# Patient Record
Sex: Female | Born: 1985 | Race: Black or African American | Hispanic: No | Marital: Married | Smoking: Never smoker
Health system: Southern US, Community
[De-identification: ages and names within clinical notes are randomized; demographics above are authoritative.]

## PROBLEM LIST (undated history)

## (undated) HISTORY — PX: MOUTH SURGERY: SHX715

---

## 2009-08-11 ENCOUNTER — Emergency Department (HOSPITAL_BASED_OUTPATIENT_CLINIC_OR_DEPARTMENT_OTHER): Admission: EM | Admit: 2009-08-11 | Discharge: 2009-08-11 | Payer: Self-pay | Admitting: Emergency Medicine

## 2012-03-16 ENCOUNTER — Emergency Department (HOSPITAL_BASED_OUTPATIENT_CLINIC_OR_DEPARTMENT_OTHER)
Admission: EM | Admit: 2012-03-16 | Discharge: 2012-03-16 | Disposition: A | Discharge status: Home / Self Care | Payer: Self-pay

## 2012-03-16 NOTE — ED Notes (Signed)
Patient registered to be seen, came to registration desk and stated she had to leave.

## 2014-02-08 ENCOUNTER — Telehealth: Payer: Self-pay | Admitting: *Deleted

## 2014-02-08 NOTE — Telephone Encounter (Signed)
Dr. Shawnie Ponsorn (OB/Gyn) office called.  Pt is there for an appt and our name is on her [facility] card.  One time NPI # given.  Office will instruct patient to have her card changed.  Emma Hensley, Darlyne RussianKristen L, CMA

## 2014-05-26 ENCOUNTER — Emergency Department (HOSPITAL_BASED_OUTPATIENT_CLINIC_OR_DEPARTMENT_OTHER): Payer: Medicaid Other

## 2014-05-26 ENCOUNTER — Emergency Department (HOSPITAL_BASED_OUTPATIENT_CLINIC_OR_DEPARTMENT_OTHER)
Admission: EM | Admit: 2014-05-26 | Discharge: 2014-05-26 | Disposition: A | Discharge status: Home / Self Care | Payer: Medicaid Other | Attending: Emergency Medicine | Admitting: Emergency Medicine

## 2014-05-26 ENCOUNTER — Encounter (HOSPITAL_BASED_OUTPATIENT_CLINIC_OR_DEPARTMENT_OTHER): Payer: Self-pay | Admitting: Emergency Medicine

## 2014-05-26 DIAGNOSIS — Y9289 Other specified places as the place of occurrence of the external cause: Secondary | ICD-10-CM | POA: Insufficient documentation

## 2014-05-26 DIAGNOSIS — IMO0002 Reserved for concepts with insufficient information to code with codable children: Secondary | ICD-10-CM | POA: Insufficient documentation

## 2014-05-26 DIAGNOSIS — S9030XA Contusion of unspecified foot, initial encounter: Secondary | ICD-10-CM | POA: Diagnosis not present

## 2014-05-26 DIAGNOSIS — S8990XA Unspecified injury of unspecified lower leg, initial encounter: Secondary | ICD-10-CM | POA: Diagnosis present

## 2014-05-26 DIAGNOSIS — Y9389 Activity, other specified: Secondary | ICD-10-CM | POA: Diagnosis not present

## 2014-05-26 DIAGNOSIS — S9031XA Contusion of right foot, initial encounter: Secondary | ICD-10-CM

## 2014-05-26 MED ORDER — IBUPROFEN 800 MG PO TABS: 800.0000 mg | ORAL_TABLET | Freq: Three times a day (TID) | ORAL | Status: DC

## 2014-05-26 MED ORDER — HYDROCODONE-ACETAMINOPHEN 5-325 MG PO TABS: 2.0000 | ORAL_TABLET | ORAL | Status: DC | PRN

## 2014-05-26 NOTE — ED Notes (Addendum)
Pt reports someone stepped on her right foot last night - pt presents with pain to top of right foot, states pain is radiating up to right knee. Pt reports when she applies weight to RLE - the bottom of her right foot hurts.

## 2014-05-26 NOTE — ED Provider Notes (Signed)
CSN: [id number]     Arrival date & time 05/26/14  1228 History   First MD Initiated Contact with Patient 05/26/14 1257     Chief Complaint  Patient presents with  . Foot Pain     (Consider location/radiation/quality/duration/timing/severity/associated sxs/prior Treatment) Patient is a 28 y.o. female presenting with lower extremity pain. The history is provided by the patient. No language interpreter was used.  Foot Pain This is a new problem. The current episode started yesterday. The problem occurs constantly. Associated symptoms include myalgias. Pertinent negatives include no joint swelling or numbness. Nothing aggravates the symptoms. She has tried nothing for the symptoms. The treatment provided no relief.    History reviewed. No pertinent past medical history. Past Surgical History  Procedure Laterality Date  . Cesarean section      2   History reviewed. No pertinent family history. History  Substance Use Topics  . Smoking status: Never Smoker   . Smokeless tobacco: Not on file  . Alcohol Use: Yes     Comment: occasional   OB History   Grav Para Term Preterm Abortions TAB SAB Ect Mult Living                 Review of Systems  Musculoskeletal: Positive for myalgias. Negative for joint swelling.  Neurological: Negative for numbness.  All other systems reviewed and are negative.     Allergies  Review of patient's allergies indicates no known allergies.  Home Medications   Prior to Admission medications   Not on File   BP 132/60  Pulse 89  Temp(Src) 98 F (36.7 C) (Oral)  Resp 18  Ht 5\' 3"  (1.6 m)  Wt 200 lb (90.719 kg)  BMI 35.44 kg/m2  SpO2 100%  LMP 05/26/2014 Physical Exam  Constitutional: She is oriented to person, place, and time. She appears well-developed and well-nourished.  HENT:  Head: Normocephalic.  Musculoskeletal: She exhibits tenderness.  Tender mid dorsal foot,   From,  Pain with range of motion  Neurological: She is alert and  oriented to person, place, and time. She has normal reflexes.  Skin: Skin is warm.  Psychiatric: She has a normal mood and affect.    ED Course  Procedures (including critical care time) Labs Review Labs Reviewed - No data to display  Imaging Review Dg Foot Complete Right  05/26/2014   CLINICAL DATA:  Someone stepped on her foot last night, pain at top and bottom of foot, cannot bear weight  EXAM: RIGHT FOOT COMPLETE - 3+ VIEW  COMPARISON:  None  FINDINGS: Osseous mineralization normal.  Joint spaces preserved.  No fracture, dislocation, or bone destruction.  IMPRESSION: Normal exam.   Electronically Signed   By: Ulyses SouthwardMark  Boles M.D.   On: 05/26/2014 13:55     EKG Interpretation None      MDM   Final diagnoses:  Contusion, foot, right, initial encounter   Post op shoe Ace wrap Crutches [place] referral    Elson AreasLeslie K Cree Napoli, PA-C 05/26/14 1443

## 2014-05-26 NOTE — ED Provider Notes (Signed)
Medical screening examination/treatment/procedure(s) were performed by non-physician practitioner and as supervising physician I was immediately available for consultation/collaboration.   EKG Interpretation None        Harshita Bernales F Chau Savell, MD 05/26/14 1509 

## 2014-05-26 NOTE — Discharge Instructions (Signed)

## 2014-07-14 ENCOUNTER — Emergency Department (HOSPITAL_BASED_OUTPATIENT_CLINIC_OR_DEPARTMENT_OTHER)
Admission: EM | Admit: 2014-07-14 | Discharge: 2014-07-14 | Disposition: A | Discharge status: Home / Self Care | Payer: Medicaid Other | Attending: Emergency Medicine | Admitting: Emergency Medicine

## 2014-07-14 ENCOUNTER — Encounter (HOSPITAL_BASED_OUTPATIENT_CLINIC_OR_DEPARTMENT_OTHER): Payer: Self-pay | Admitting: Emergency Medicine

## 2014-07-14 DIAGNOSIS — B354 Tinea corporis: Secondary | ICD-10-CM | POA: Diagnosis not present

## 2014-07-14 DIAGNOSIS — Z79899 Other long term (current) drug therapy: Secondary | ICD-10-CM | POA: Diagnosis not present

## 2014-07-14 DIAGNOSIS — R21 Rash and other nonspecific skin eruption: Secondary | ICD-10-CM | POA: Diagnosis present

## 2014-07-14 DIAGNOSIS — Z791 Long term (current) use of non-steroidal anti-inflammatories (NSAID): Secondary | ICD-10-CM | POA: Insufficient documentation

## 2014-07-14 MED ORDER — KETOCONAZOLE 2 % EX CREA: 1.0000 "application " | TOPICAL_CREAM | Freq: Every day | CUTANEOUS | Status: DC

## 2014-07-14 NOTE — Discharge Instructions (Signed)

## 2014-07-14 NOTE — ED Provider Notes (Signed)
CSN: [id number]     Arrival date & time 07/14/14  7829 History   First MD Initiated Contact with Patient 07/14/14 [phone]     Chief Complaint  Patient presents with  . Rash    bilateral arms and on back     (Consider location/radiation/quality/duration/timing/severity/associated sxs/prior Treatment) Patient is a 28 y.o. female presenting with rash.  Rash Location:  Shoulder/arm Shoulder/arm rash location:  L arm and R arm Quality: dryness   Severity:  Mild Onset quality:  Unable to specify Timing:  Constant Progression:  Worsening Relieved by:  Nothing Worsened by:  Nothing tried Ineffective treatments:  None tried Associated symptoms: no abdominal pain, no diarrhea, no fatigue, no fever, no headaches, no induration, no joint pain, no myalgias and no nausea     History reviewed. No pertinent past medical history. Past Surgical History  Procedure Laterality Date  . Cesarean section      2   History reviewed. No pertinent family history. History  Substance Use Topics  . Smoking status: Never Smoker   . Smokeless tobacco: Not on file  . Alcohol Use: Yes     Comment: occasional   OB History   Grav Para Term Preterm Abortions TAB SAB Ect Mult Living                 Review of Systems  Constitutional: Negative for fever and fatigue.  Gastrointestinal: Negative for nausea, abdominal pain and diarrhea.  Musculoskeletal: Negative for arthralgias and myalgias.  Skin: Positive for rash.  Neurological: Negative for headaches.  All other systems reviewed and are negative.     Allergies  Review of patient's allergies indicates no known allergies.  Home Medications   Prior to Admission medications   Medication Sig Start Date End Date Taking? Authorizing Provider  HYDROcodone-acetaminophen (NORCO/VICODIN) 5-325 MG per tablet Take 2 tablets by mouth every 4 (four) hours as needed. 05/26/14   Elson Areas, PA-C  ibuprofen (ADVIL,MOTRIN) 800 MG tablet Take 1 tablet (800 mg  total) by mouth 3 (three) times daily. 05/26/14   Elson Areas, PA-C  ketoconazole (NIZORAL) 2 % cream Apply 1 application topically daily. 07/14/14   Hilario Quarry, MD   BP 132/55  Pulse 85  Temp(Src) 98 F (36.7 C) (Oral)  Resp 18  Ht  (1.6 m)  Wt 190 lb (86.183 kg)  BMI 33.67 kg/m2  SpO2 100% Physical Exam  Nursing note and vitals reviewed. Constitutional: She is oriented to person, place, and time. She appears well-developed and well-nourished.  HENT:  Head: Normocephalic.  Eyes: Pupils are equal, round, and reactive to light.  Neck: Normal range of motion.  Cardiovascular: Normal rate.   Pulmonary/Chest: Effort normal.  Abdominal: Soft.  Musculoskeletal: Normal range of motion.  Neurological: She is alert and oriented to person, place, and time.  Skin: Skin is warm and dry. Rash noted.     Two round skin patches 3x3 cm lighter that surrounding skin    ED Course  Procedures (including critical care time) L MDM   Final diagnoses:  Tinea corporis        Hilario Quarry, MD 07/14/14 [phone]

## 2014-07-14 NOTE — ED Notes (Signed)
MD at bedside. 

## 2015-04-06 ENCOUNTER — Emergency Department (HOSPITAL_BASED_OUTPATIENT_CLINIC_OR_DEPARTMENT_OTHER)
Admission: EM | Admit: 2015-04-06 | Discharge: 2015-04-06 | Disposition: A | Discharge status: Home / Self Care | Payer: Medicaid Other | Attending: Emergency Medicine | Admitting: Emergency Medicine

## 2015-04-06 ENCOUNTER — Encounter (HOSPITAL_BASED_OUTPATIENT_CLINIC_OR_DEPARTMENT_OTHER): Payer: Self-pay

## 2015-04-06 DIAGNOSIS — Z5189 Encounter for other specified aftercare: Secondary | ICD-10-CM

## 2015-04-06 DIAGNOSIS — Z792 Long term (current) use of antibiotics: Secondary | ICD-10-CM | POA: Insufficient documentation

## 2015-04-06 DIAGNOSIS — G8918 Other acute postprocedural pain: Secondary | ICD-10-CM | POA: Insufficient documentation

## 2015-04-06 DIAGNOSIS — Z4801 Encounter for change or removal of surgical wound dressing: Secondary | ICD-10-CM | POA: Insufficient documentation

## 2015-04-06 DIAGNOSIS — K1379 Other lesions of oral mucosa: Secondary | ICD-10-CM | POA: Diagnosis not present

## 2015-04-06 DIAGNOSIS — R220 Localized swelling, mass and lump, head: Secondary | ICD-10-CM | POA: Insufficient documentation

## 2015-04-06 MED ORDER — OXYCODONE-ACETAMINOPHEN 5-325 MG PO TABS
ORAL_TABLET | ORAL | Status: DC
Start: 2015-04-06 — End: 2020-08-15

## 2015-04-06 MED ORDER — METRONIDAZOLE 500 MG PO TABS: 500.0000 mg | ORAL_TABLET | Freq: Two times a day (BID) | ORAL | Status: DC

## 2015-04-06 MED ORDER — OXYCODONE-ACETAMINOPHEN 5-325 MG PO TABS
1.0000 | ORAL_TABLET | Freq: Once | ORAL | Status: AC
  Administered 2015-04-06: 1 via ORAL
  Filled 2015-04-06: qty 1

## 2015-04-06 MED ORDER — METRONIDAZOLE 500 MG PO TABS
500.0000 mg | ORAL_TABLET | Freq: Once | ORAL | Status: AC
  Administered 2015-04-06: 500 mg via ORAL
  Filled 2015-04-06: qty 1

## 2015-04-06 NOTE — ED Notes (Signed)
Pt reports oral surgery on Wednesday - 8 teeth removed including wisdom teeth - pt reports she began to eat today but thinks she has something "stuck in her gums."

## 2015-04-06 NOTE — ED Provider Notes (Signed)
CSN: [id number]     Arrival date & time 04/06/15  2009 History   First MD Initiated Contact with Patient 04/06/15 2023     Chief Complaint  Patient presents with  . Post-op Problem     (Consider location/radiation/quality/duration/timing/severity/associated sxs/prior Treatment) HPI   Emma Hensley is a 29 y.o. female complaining of foreign body sensation to right upper side of dental extraction. Patient had 8 extractions 3 days ago. She's compliant with her penicillin and has been taking Vicodin and ibuprofen at home with little relief. She states that she started eating solid food today but she feels that something is stuck in her gums. She denies increasing swelling, fever, chills, difficulty swallowing since the extraction. Patient has been rinsing the mouth out but still feels like something is stuck in the area. She rates her pain at 9 out of 10, she's taking Vicodin at home with little relief.  History reviewed. No pertinent past medical history. Past Surgical History  Procedure Laterality Date  . Cesarean section      2  . Mouth surgery     History reviewed. No pertinent family history. History  Substance Use Topics  . Smoking status: Never Smoker   . Smokeless tobacco: Not on file  . Alcohol Use: Yes     Comment: occasional   OB History    No data available     Review of Systems  10 systems reviewed and found to be negative, except as noted in the HPI.   Allergies  Review of patient's allergies indicates no known allergies.  Home Medications   Prior to Admission medications   Medication Sig Start Date End Date Taking? Authorizing Provider  ibuprofen (ADVIL,MOTRIN) 600 MG tablet Take 600 mg by mouth every 6 (six) hours as needed.   Yes Historical Provider, MD  PENICILLIN V POTASSIUM PO Take by mouth.   Yes Historical Provider, MD  metroNIDAZOLE (FLAGYL) 500 MG tablet Take 1 tablet (500 mg total) by mouth 2 (two) times daily. One po bid x 7 days 04/06/15   Wynetta EmeryNicole  Norlene Lanes, PA-C  oxyCODONE-acetaminophen (PERCOCET/ROXICET) 5-325 MG per tablet 1 to 2 tabs PO q6hrs  PRN for pain 04/06/15   Joni ReiningNicole Sanyiah Kanzler, PA-C   BP 118/56 mmHg  Pulse 81  Temp(Src) 99.1 F (37.3 C) (Oral)  Resp 18  Ht 5\' 3"  (1.6 m)  Wt 190 lb (86.183 kg)  BMI 33.67 kg/m2  SpO2 100%  LMP 03/05/2015 Physical Exam  Constitutional: She is oriented to person, place, and time. She appears well-developed and well-nourished. No distress.  HENT:  Head: Normocephalic.  Face is diffusely swollen, no foreign body in side of dental extraction were patient has this sensation. No swelling or purulent discharge.  Eyes: Conjunctivae and EOM are normal.  Cardiovascular: Normal rate.   Pulmonary/Chest: Effort normal. No stridor.  Musculoskeletal: Normal range of motion.  Neurological: She is alert and oriented to person, place, and time.  Psychiatric: She has a normal mood and affect.  Nursing note and vitals reviewed.   ED Course  Procedures (including critical care time) Labs Review Labs Reviewed - No data to display  Imaging Review No results found.   EKG Interpretation None      MDM   Final diagnoses:  Visit for wound check    Filed Vitals:   04/06/15 2013  BP: 118/56  Pulse: 81  Temp: 99.1 F (37.3 C)  TempSrc: Oral  Resp: 18  Height: 5\' 3"  (1.6 m)  Weight: 190 lb (  86.183 kg)  SpO2: 100%    Medications  metroNIDAZOLE (FLAGYL) tablet 500 mg (500 mg Oral Given 04/06/15 2107)  oxyCODONE-acetaminophen (PERCOCET/ROXICET) 5-325 MG per tablet 1 tablet (1 tablet Oral Given 04/06/15 2106)    Emma Hensley is a pleasant 29 y.o. female presenting with pain and foreign body sensation to site of dental extraction after patient began to eat food today. No foreign bodies appreciated on physical exam. I've encouraged patient to continue to eat a soft diet, rinse with water or salt water after eating. Patient's temperature is mildly elevated at 99.1, encouraged her to continue  penicillin and will add on metronidazole. Patient states her pain is severe, will change Norco to Percocet. Advised her to follow closely with her oral surgeon and return to the ED if she has new or worsening symptoms.  Evaluation does not show pathology that would require ongoing emergent intervention or inpatient treatment. Pt is hemodynamically stable and mentating appropriately. Discussed findings and plan with patient/guardian, who agrees with care plan. All questions answered. Return precautions discussed and outpatient follow up given.   New Prescriptions   METRONIDAZOLE (FLAGYL) 500 MG TABLET    Take 1 tablet (500 mg total) by mouth 2 (two) times daily. One po bid x 7 days   OXYCODONE-ACETAMINOPHEN (PERCOCET/ROXICET) 5-325 MG PER TABLET    1 to 2 tabs PO q6hrs  PRN for pain         Wynetta Emeryicole Saber Dickerman, PA-C 04/06/15 2116  Glynn OctaveStephen Rancour, MD 04/06/15 2201

## 2015-04-06 NOTE — Discharge Instructions (Signed)
Eat a soft diet for the next 24-48 hours.  Rinse your mouth with saltwater or regular water after you eat any food.  Do not swallow blood or pus becuase it will cause you to vomit,  spit out instead.  Take percocet for breakthrough pain, do not drink alcohol, drive, care for children or do other critical tasks while taking percocet.  Return to the emergency room for fever, change in vision, redness to the face that rapidly spreads towards the eye, nausea or vomiting, difficulty swallowing or shortness of breath.  Take your antibiotics as directed and to the end of the course. DO NOT drink alcohol when taking metronidazole, it will make you very sick!   Follow your oral surgeon early next week.

## 2016-04-05 IMAGING — CR DG FOOT COMPLETE 3+V*R*
3 series · 3 of 3 positions shown · non-contrast
Comparison: None

CLINICAL DATA: Someone stepped on her foot last night, pain at top
and bottom of foot, cannot bear weight

EXAM:
RIGHT FOOT COMPLETE - 3+ VIEW

[t foot ap right]
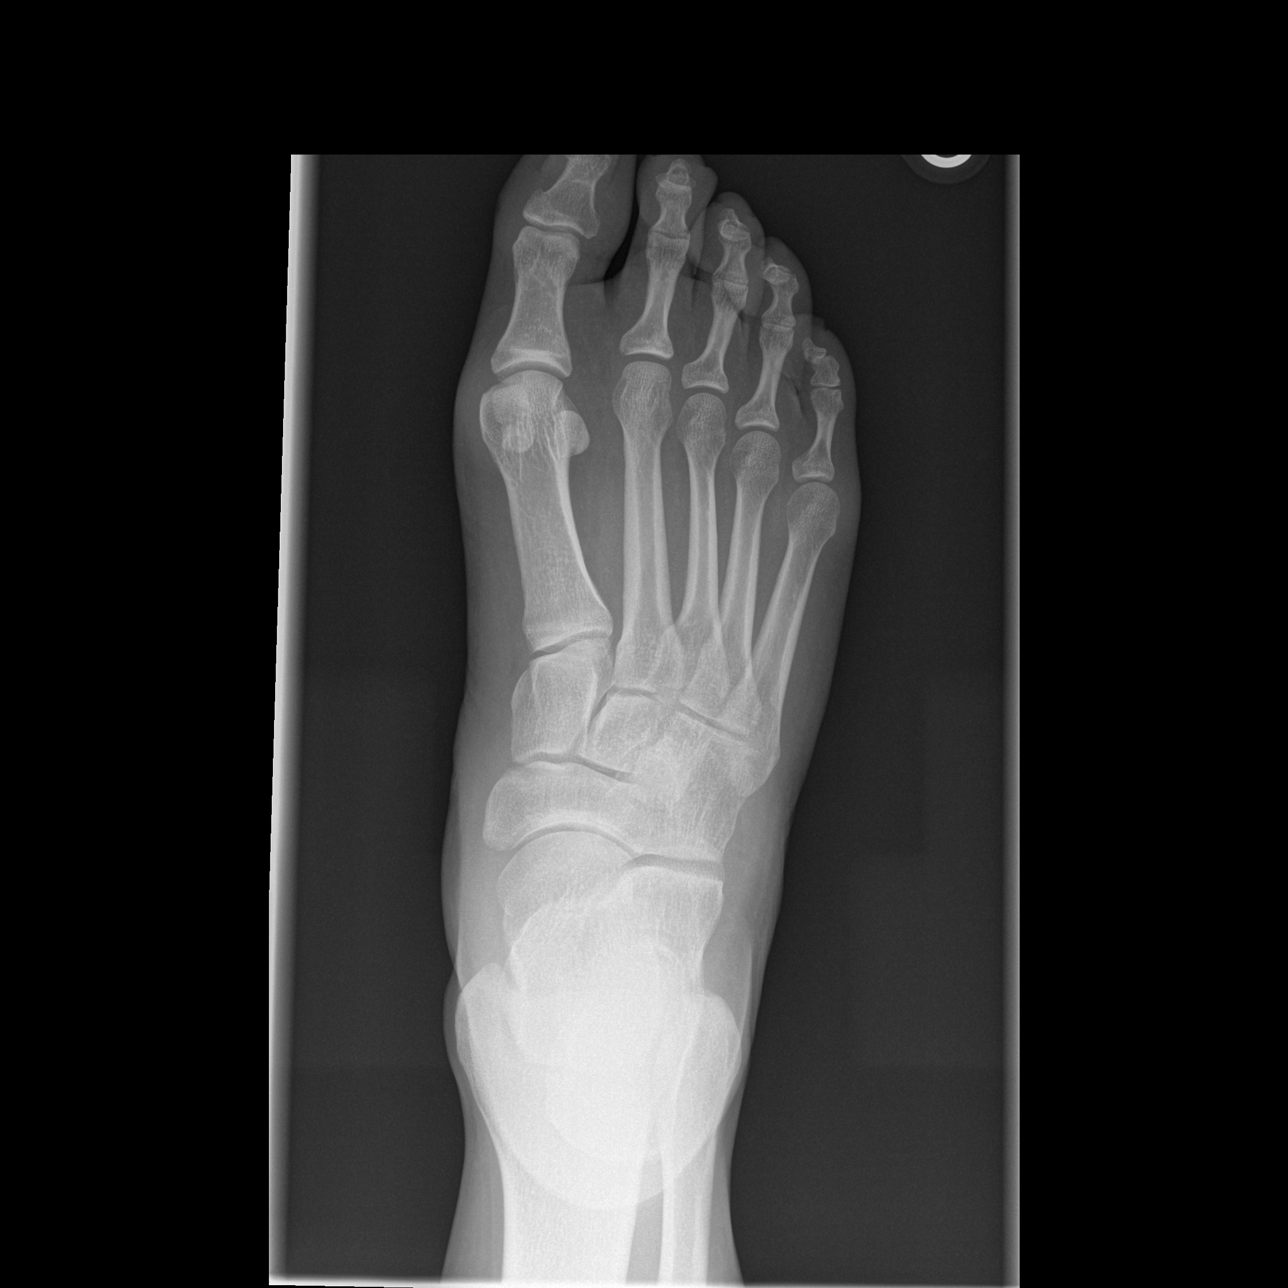

[t foot oblique right]
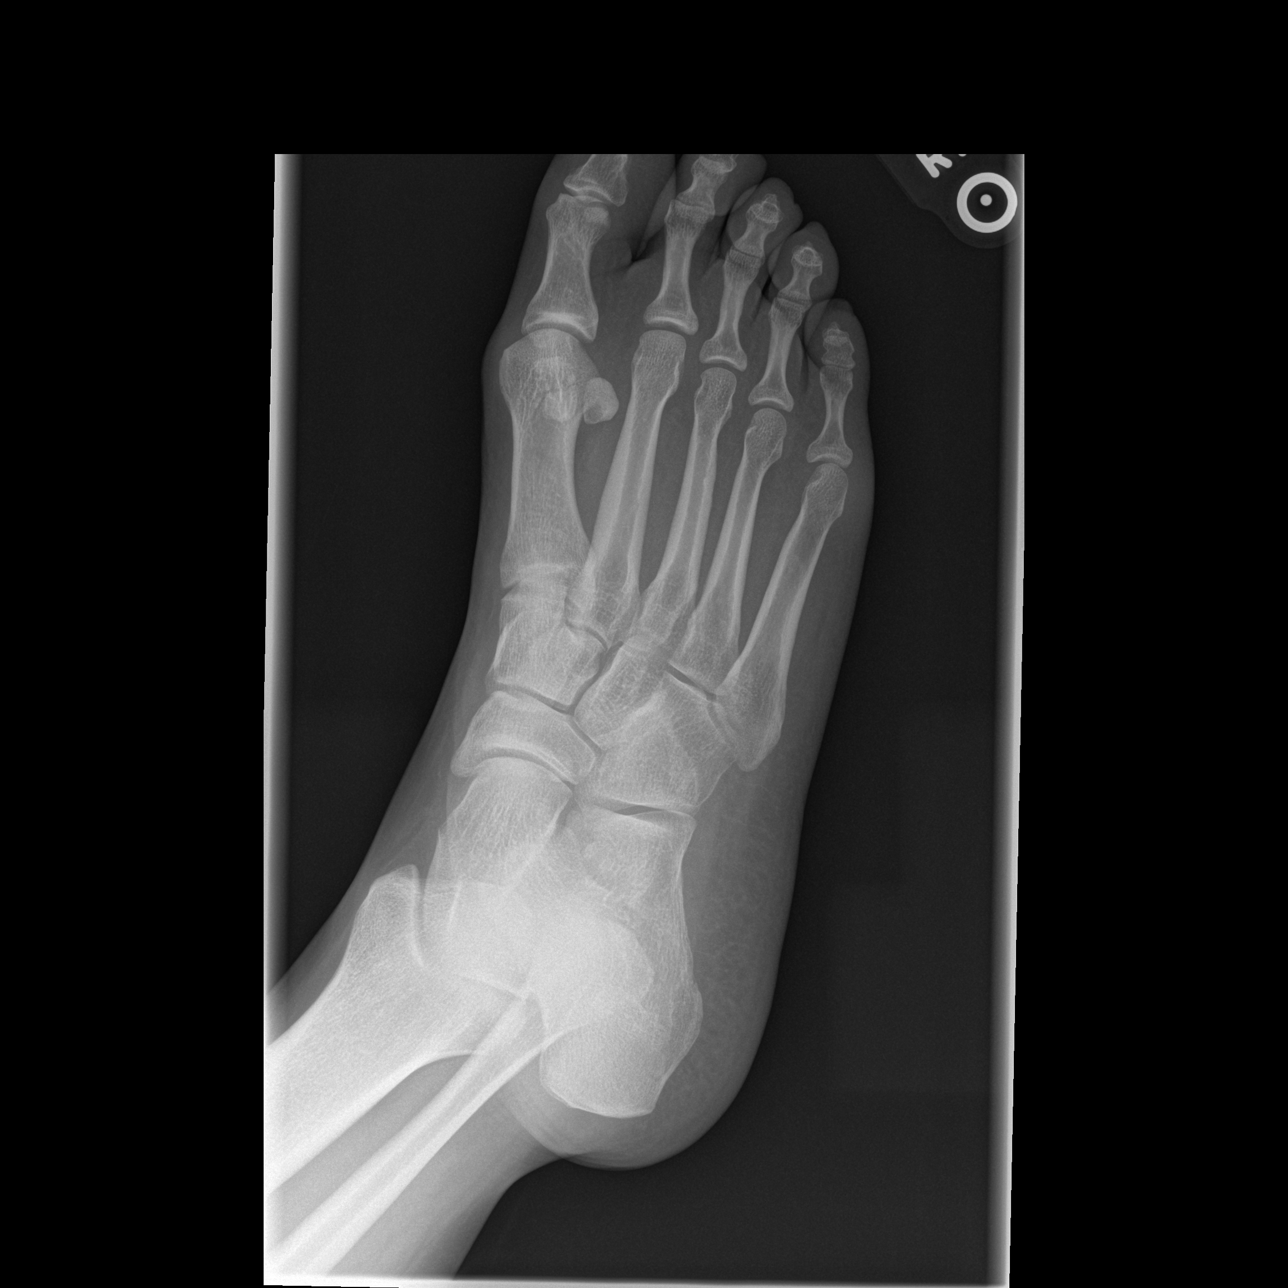

[t foot lat right]
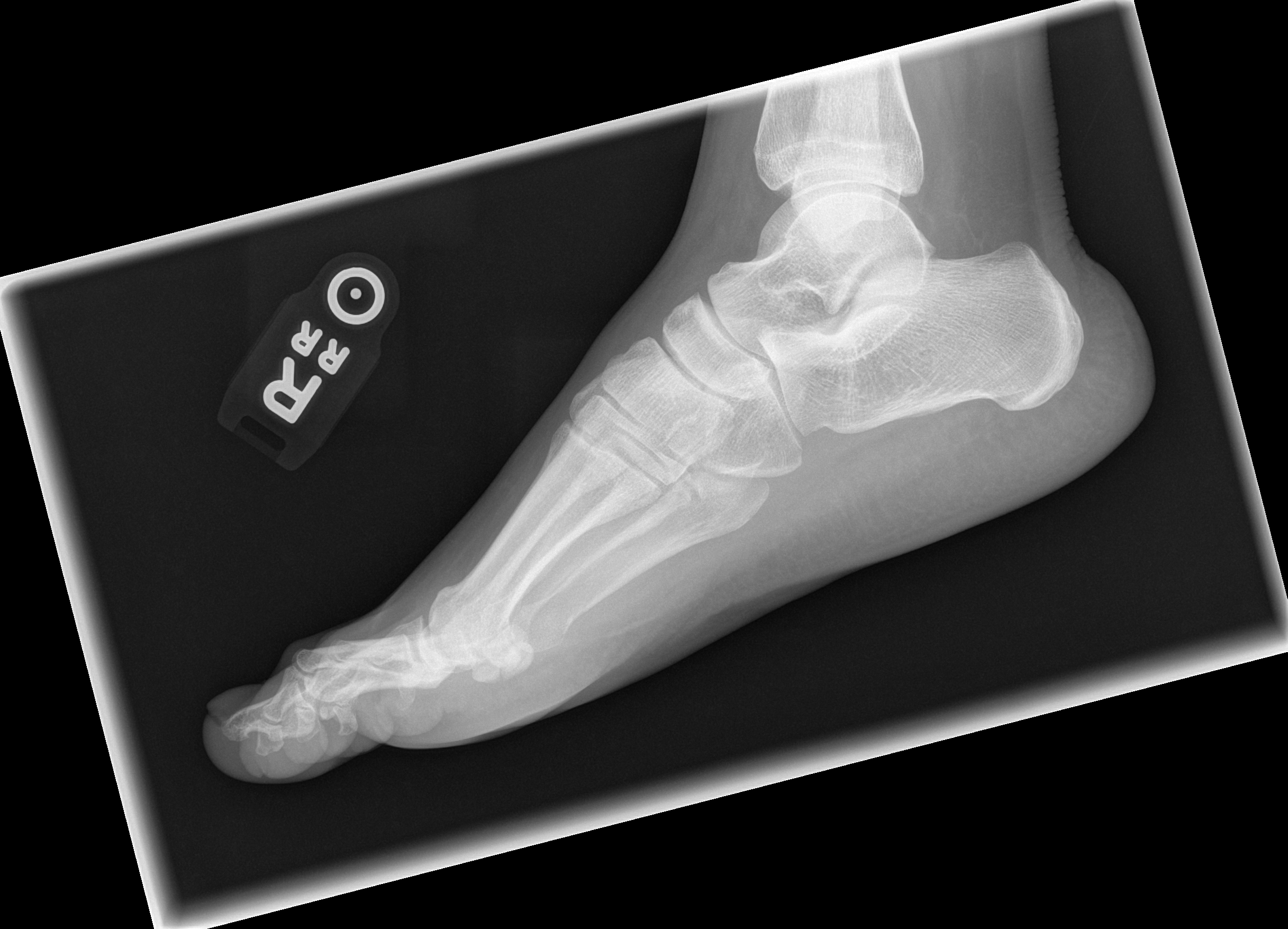

[3 of 3 positions shown; findings below may reference images not displayed]

FINDINGS: Osseous mineralization normal.

Joint spaces preserved.

No fracture, dislocation, or bone destruction.
IMPRESSION: Normal exam.

## 2019-06-10 ENCOUNTER — Encounter: Payer: Self-pay | Admitting: Obstetrics & Gynecology

## 2019-06-10 ENCOUNTER — Other Ambulatory Visit (HOSPITAL_COMMUNITY)
Admission: RE | Admit: 2019-06-10 | Discharge: 2019-06-10 | Disposition: A | Discharge status: Home / Self Care | Payer: Medicaid Other | Source: Ambulatory Visit | Attending: Obstetrics & Gynecology | Admitting: Obstetrics & Gynecology

## 2019-06-10 ENCOUNTER — Other Ambulatory Visit: Payer: Self-pay

## 2019-06-10 ENCOUNTER — Ambulatory Visit (INDEPENDENT_AMBULATORY_CARE_PROVIDER_SITE_OTHER): Payer: Medicaid Other | Admitting: Obstetrics & Gynecology

## 2019-06-10 VITALS — BP 120/70 | HR 69 | Ht 63.0 in | Wt 180.0 lb

## 2019-06-10 DIAGNOSIS — L42 Pityriasis rosea: Secondary | ICD-10-CM

## 2019-06-10 DIAGNOSIS — Z01419 Encounter for gynecological examination (general) (routine) without abnormal findings: Secondary | ICD-10-CM

## 2019-06-10 MED ORDER — BETAMETHASONE VALERATE 0.1 % EX OINT: 1.0000 "application " | TOPICAL_OINTMENT | Freq: Two times a day (BID) | CUTANEOUS | 2 refills | Status: DC

## 2019-06-10 NOTE — Progress Notes (Signed)
Subjective:     Emma Hensley is a 33 y.o. female here for a routine exam.  LMP irreg cycles due to LnIUD Current complaints: occ pain in pelvis since IUD. Does not limit ADLs.  Pt reports a rash on her back that is very itchy. It hs been present for several weeks. She has been putting alcohol in it.     Gynecologic History No LMP recorded. Contraception: IUD Last Pap:  >1 year prev Last mammogram: n/a  Obstetric History OB History  No obstetric history on file.   The following portions of the patient's history were reviewed and updated as appropriate: allergies, current medications, past family history, past medical history, past social history, past surgical history and problem list.  Review of Systems Pertinent items are noted in HPI.    Objective:  BP 120/70   Pulse 69   Ht 5\' 3"  (1.6 m)   Wt 180 lb (81.6 kg)   BMI 31.89 kg/m   General Appearance:    Alert, cooperative, no distress, appears stated age  Head:    Normocephalic, without obvious abnormality, atraumatic  Eyes:    conjunctiva/corneas clear, EOM's intact, both eyes  Ears:    Normal external ear canals, both ears  Nose:   Nares normal, septum midline, mucosa normal, no drainage    or sinus tenderness  Throat:   Lips, mucosa, and tongue normal; teeth and gums normal  Neck:   Supple, symmetrical, trachea midline, no adenopathy;    thyroid:  no enlargement/tenderness/nodules  Back:     Symmetric, no curvature, ROM normal, no CVA tenderness  Lungs:     respirations unlabored  Chest Wall:    No tenderness or deformity   Heart:    Regular rate and rhythm  Breast Exam:    No tenderness, masses, or nipple abnormality  Abdomen:     Soft, non-tender, bowel sounds active all four quadrants,    no masses, no organomegaly  Genitalia:    Normal female without lesion, discharge or tenderness     Extremities:   Extremities normal, atraumatic, no cyanosis or edema  Pulses:   2+ and symmetric all extremities  Skin:   Skin  color, texture, turgor normal, on back there are diffuse excoriations with a larger lesion on the left shoulder.     Assessment:    Healthy female exam.   IUD stings noted  STI screen Pityriasis rosea   Plan:   Diagnoses and all orders for this visit:  Well woman exam with routine gynecological exam -     Cytology - PAP( [place]) -     HIV antibody (with reflex) -     Hepatitis C Antibody -     Hepatitis B Surface AntiGEN -     RPR  Well female exam with routine gynecological exam  Pityriasis rosea -     betamethasone valerate ointment (VALISONE) 0.1 %; Apply 1 application topically 2 (two) times daily.  pt to f/u in 3 months to eval rash or sooner prn F/u 1 year for annual  Quin Mathenia L. Harraway-Smith, M.D., Cherlynn June

## 2019-06-10 NOTE — Progress Notes (Signed)
Patient has IUD for three years (placed by DR. Dorn). Patient having irregular periods with IUD. Patient also complaining of hot flashes. Kathrene Alu RN

## 2019-06-13 ENCOUNTER — Encounter: Payer: Self-pay | Admitting: Obstetrics & Gynecology

## 2019-06-15 LAB — CYTOLOGY - PAP
Chlamydia: NEGATIVE
HPV: NOT DETECTED
Neisseria Gonorrhea: NEGATIVE

## 2019-06-16 ENCOUNTER — Telehealth: Payer: Self-pay

## 2019-06-16 NOTE — Telephone Encounter (Signed)
Called pt regarding medication.Left message for pt to call the office back. chiquita l wilson, CMA 

## 2019-06-16 NOTE — Telephone Encounter (Signed)
-----   Message from Lavonia Drafts, MD sent at 06/16/2019  2:54 PM EDT ----- Regarding: RE: Medication Question Only the 1% hydrocortisone OTC. She is NOT to use the med I prescribed on her face.   Clh-S  ----- Message ----- From: Valentina Lucks, CMA Sent: 06/16/2019   2:12 PM EDT To: Lavonia Drafts, MD Subject: Medication Question                            Pt wants a Rx for a medication that is safe to use on her face for Pityriasis rosea.

## 2019-06-17 ENCOUNTER — Telehealth: Payer: Self-pay

## 2019-06-17 NOTE — Telephone Encounter (Signed)
Patient called back and made aware to not put medication on her face. Patient is to used 1% hydrocortisone OTC cream on her face. Kathrene Alu RN

## 2019-06-17 NOTE — Telephone Encounter (Signed)
-----   Message from Lavonia Drafts, MD sent at 06/16/2019 11:35 AM EDT ----- Please call pt. Her PAP shows low grade cells. Her high risk HPV is neg so she needs to f/u in 1 year for a repeat PAP.   Her STI screen was neg.   Thx, clh-S

## 2019-06-17 NOTE — Telephone Encounter (Signed)
Patient called and made aware that pap smear showed some Low grade cervical cell changes but the HPV was negative. It is important for her to come back in one year for another pap smear.   Made aware that Gon/Chl were both negative but patient STI labwork not done. She states the lab was busy the day she went to the lab and she left because she had to pick up her husband. Patient states she will go get blood work done on Monday August 4th. Kathrene Alu RN

## 2019-08-01 ENCOUNTER — Ambulatory Visit: Payer: Medicaid Other | Admitting: Obstetrics & Gynecology

## 2019-08-19 ENCOUNTER — Ambulatory Visit: Payer: Medicaid Other | Admitting: Obstetrics & Gynecology

## 2019-11-21 ENCOUNTER — Telehealth: Payer: Self-pay

## 2019-11-21 NOTE — Telephone Encounter (Signed)
Patient called stating that her fingernails are "darker than normal". Patient states this is a problem that has occurred over the last several months.  Patient states she would like to make appointment with Dr. Erin Fulling. Ask her if she had a PCP and to make an appointment with them as we are considered a specialty(OBGYN). Patient has medicaid so recommended [facility] as they accept this form of insurance. Armandina Stammer RN

## 2020-08-15 ENCOUNTER — Other Ambulatory Visit: Payer: Self-pay

## 2020-08-15 ENCOUNTER — Other Ambulatory Visit (HOSPITAL_COMMUNITY)
Admission: RE | Admit: 2020-08-15 | Discharge: 2020-08-15 | Disposition: A | Discharge status: Home / Self Care | Payer: Medicaid Other | Source: Ambulatory Visit | Attending: Obstetrics & Gynecology | Admitting: Obstetrics & Gynecology

## 2020-08-15 ENCOUNTER — Ambulatory Visit (INDEPENDENT_AMBULATORY_CARE_PROVIDER_SITE_OTHER): Payer: Medicaid Other | Admitting: Obstetrics & Gynecology

## 2020-08-15 ENCOUNTER — Encounter: Payer: Self-pay | Admitting: Obstetrics & Gynecology

## 2020-08-15 VITALS — BP 117/61 | HR 80 | Ht 62.6 in | Wt 184.1 lb

## 2020-08-15 DIAGNOSIS — Z3009 Encounter for other general counseling and advice on contraception: Secondary | ICD-10-CM | POA: Diagnosis not present

## 2020-08-15 DIAGNOSIS — Z113 Encounter for screening for infections with a predominantly sexual mode of transmission: Secondary | ICD-10-CM

## 2020-08-15 DIAGNOSIS — R102 Pelvic and perineal pain: Secondary | ICD-10-CM | POA: Diagnosis not present

## 2020-08-15 DIAGNOSIS — L42 Pityriasis rosea: Secondary | ICD-10-CM | POA: Diagnosis not present

## 2020-08-15 DIAGNOSIS — Z01419 Encounter for gynecological examination (general) (routine) without abnormal findings: Secondary | ICD-10-CM | POA: Insufficient documentation

## 2020-08-15 MED ORDER — BETAMETHASONE VALERATE 0.1 % EX OINT: 1.0000 "application " | TOPICAL_OINTMENT | Freq: Two times a day (BID) | CUTANEOUS | 2 refills | Status: AC

## 2020-08-15 MED ORDER — DICLOFENAC SODIUM 75 MG PO TBEC: DELAYED_RELEASE_TABLET | ORAL | 2 refills | Status: AC

## 2020-08-15 MED ORDER — DICLOFENAC SODIUM 50 MG PO TBEC: 50.0000 mg | DELAYED_RELEASE_TABLET | Freq: Four times a day (QID) | ORAL | 2 refills | Status: DC | PRN

## 2020-08-15 NOTE — Progress Notes (Addendum)
Subjective:     Emma Hensley is a 34 y.o. female here for a routine exam.  Current complaints: Pt reports that she has had some pelvic pain that has been recent since she restarted work. She works at [employer] and is active. She had painful cycles prior to the IUD and has recently started having spotting. The pain makes intercourse difficult.        Gynecologic History No LMP recorded. (Menstrual status: IUD). Contraception: IUD  Placed 20216 Last Pap: 06/10/2019. LGSIL Last mammogram: n/a  Obstetric History OB History  Gravida Para Term Preterm AB Living  2 2 2  0 0 2  SAB TAB Ectopic Multiple Live Births  0 0 0 0 2    # Outcome Date GA Lbr Len/2nd Weight Sex Delivery Anes PTL Lv  2 Term      CS-LTranv     1 Term      CS-LTranv      The following portions of the patient's history were reviewed and updated as appropriate: allergies, current medications, past family history, past medical history, past social history, past surgical history and problem list.  Review of Systems Pertinent items are noted in HPI.    Objective:  BP 117/61   Pulse 80   Ht 5' 2.6" (1.59 m)   Wt 184 lb 1.3 oz (83.5 kg)   BMI 33.03 kg/m   General Appearance:    Alert, cooperative, no distress, appears stated age  Head:    Normocephalic, without obvious abnormality, atraumatic  Eyes:    conjunctiva/corneas clear, EOM's intact, both eyes  Ears:    Normal external ear canals, both ears  Nose:   Nares normal, septum midline, mucosa normal, no drainage    or sinus tenderness  Throat:   Lips, mucosa, and tongue normal; teeth and gums normal  Neck:   Supple, symmetrical, trachea midline, no adenopathy;    thyroid:  no enlargement/tenderness/nodules  Back:     Symmetric, no curvature, ROM normal, no CVA tenderness  Lungs:     respirations unlabored  Chest Wall:    No tenderness or deformity   Heart:    Regular rate and rhythm  Breast Exam:    No tenderness, masses, or nipple abnormality  Abdomen:     Soft,  non-tender, bowel sounds active all four quadrants,    no masses, no organomegaly  Genitalia:    Normal female without lesion, discharge or tenderness   Cervix: IUD noted; uterus small and mobile. No CMT   Extremities:   Extremities normal, atraumatic, no cyanosis or edema  Pulses:   2+ and symmetric all extremities  Skin:   Skin color, texture, turgor normal, no rashes or lesions   Emma Hensley was seen today for gynecologic exam.  Diagnoses and all orders for this visit:  Well female exam with routine gynecological exam -     Cancel: Cytology - PAP( [place]) -     Cytology - PAP( [place]) -     HIV antibody (with reflex) -     Hepatitis C Antibody -     Hepatitis B Surface AntiGEN -     RPR  Encounter for counseling regarding contraception  Pityriasis rosea -     betamethasone valerate ointment (VALISONE) 0.1 %; Apply 1 application topically 2 (two) times daily.  Pelvic pain in female -     diclofenac (VOLTAREN) 50 MG EC tablet; Take 1 tablet (50 mg total) by mouth 4 (four) times daily as needed for  moderate pain.  Routine screening for STI (sexually transmitted infection)   If pain cont. Pt will come in to have her LnIUD changed due to resurgence of menses and dysmenorrhea.   f/u In 1 year or sooner prn  Emma Hensley, M.D., Emma Hensley

## 2020-08-15 NOTE — Addendum Note (Signed)
Addended by: Mikey Bussing on: 08/15/2020 04:55 PM   Modules accepted: Orders

## 2020-08-16 LAB — HEPATITIS C ANTIBODY: Hep C Virus Ab: <0.1 s/co ratio (ref 0.0–0.9)

## 2020-08-16 LAB — HEPATITIS B SURFACE ANTIGEN: Hepatitis B Surface Ag: NEGATIVE

## 2020-08-16 LAB — HIV ANTIBODY (ROUTINE TESTING W REFLEX): HIV Screen 4th Generation wRfx: NONREACTIVE

## 2020-08-16 LAB — RPR: RPR Ser Ql: NONREACTIVE

## 2020-08-18 LAB — CYTOLOGY - PAP
Chlamydia: NEGATIVE
Comment: NEGATIVE
Comment: NEGATIVE
Comment: NORMAL
High risk HPV: NEGATIVE
Neisseria Gonorrhea: NEGATIVE

## 2020-12-01 ENCOUNTER — Encounter (HOSPITAL_BASED_OUTPATIENT_CLINIC_OR_DEPARTMENT_OTHER): Payer: Self-pay | Admitting: *Deleted

## 2020-12-01 ENCOUNTER — Other Ambulatory Visit: Payer: Self-pay

## 2020-12-01 ENCOUNTER — Emergency Department (HOSPITAL_BASED_OUTPATIENT_CLINIC_OR_DEPARTMENT_OTHER)
Admission: EM | Admit: 2020-12-01 | Discharge: 2020-12-01 | Disposition: A | Discharge status: Home / Self Care | Payer: Medicaid Other | Attending: Emergency Medicine | Admitting: Emergency Medicine

## 2020-12-01 DIAGNOSIS — R040 Epistaxis: Secondary | ICD-10-CM | POA: Diagnosis present

## 2020-12-01 DIAGNOSIS — N3000 Acute cystitis without hematuria: Secondary | ICD-10-CM

## 2020-12-01 LAB — URINALYSIS, ROUTINE W REFLEX MICROSCOPIC
Bilirubin Urine: NEGATIVE
Glucose, UA: NEGATIVE mg/dL
Hgb urine dipstick: NEGATIVE
Ketones, ur: 40 mg/dL — AB
Leukocytes,Ua: NEGATIVE
Nitrite: NEGATIVE
Protein, ur: 30 mg/dL — AB
Specific Gravity, Urine: 1.01 (ref 1.005–1.030)
pH: 8 (ref 5.0–8.0)

## 2020-12-01 LAB — PREGNANCY, URINE: Preg Test, Ur: NEGATIVE

## 2020-12-01 LAB — CBC WITH DIFFERENTIAL/PLATELET
Abs Immature Granulocytes: 0.01 10*3/uL (ref 0.00–0.07)
Basophils Absolute: 0 10*3/uL (ref 0.0–0.1)
Basophils Relative: 0 %
Eosinophils Absolute: 0 10*3/uL (ref 0.0–0.5)
Eosinophils Relative: 0 %
HCT: 42.7 % (ref 36.0–46.0)
Hemoglobin: 14.2 g/dL (ref 12.0–15.0)
Immature Granulocytes: 0 %
Lymphocytes Relative: 23 %
Lymphs Abs: 1.7 10*3/uL (ref 0.7–4.0)
MCH: 27.5 pg (ref 26.0–34.0)
MCHC: 33.3 g/dL (ref 30.0–36.0)
MCV: 82.6 fL (ref 80.0–100.0)
Monocytes Absolute: 1 10*3/uL (ref 0.1–1.0)
Monocytes Relative: 14 %
Neutro Abs: 4.6 10*3/uL (ref 1.7–7.7)
Neutrophils Relative %: 63 %
Platelets: 253 10*3/uL (ref 150–400)
RBC: 5.17 MIL/uL — ABNORMAL HIGH (ref 3.87–5.11)
RDW: 13 % (ref 11.5–15.5)
WBC: 7.3 10*3/uL (ref 4.0–10.5)
nRBC: 0 % (ref 0.0–0.2)

## 2020-12-01 LAB — COMPREHENSIVE METABOLIC PANEL
ALT: 34 U/L (ref 0–44)
AST: 41 U/L (ref 15–41)
Albumin: 4.2 g/dL (ref 3.5–5.0)
Alkaline Phosphatase: 49 U/L (ref 38–126)
Anion gap: 12 (ref 5–15)
BUN: 15 mg/dL (ref 6–20)
CO2: 22 mmol/L (ref 22–32)
Calcium: 9.2 mg/dL (ref 8.9–10.3)
Chloride: 104 mmol/L (ref 98–111)
Creatinine, Ser: 0.89 mg/dL (ref 0.44–1.00)
GFR, Estimated: >60 mL/min (ref >60)
Glucose, Bld: 103 mg/dL — ABNORMAL HIGH (ref 70–99)
Potassium: 3.4 mmol/L — ABNORMAL LOW (ref 3.5–5.1)
Sodium: 138 mmol/L (ref 135–145)
Total Bilirubin: 0.7 mg/dL (ref 0.3–1.2)
Total Protein: 7.7 g/dL (ref 6.5–8.1)

## 2020-12-01 LAB — RAPID URINE DRUG SCREEN, HOSP PERFORMED
Amphetamines: NOT DETECTED
Barbiturates: NOT DETECTED
Benzodiazepines: NOT DETECTED
Cocaine: NOT DETECTED
Opiates: NOT DETECTED
Tetrahydrocannabinol: POSITIVE — AB

## 2020-12-01 LAB — URINALYSIS, MICROSCOPIC (REFLEX): RBC / HPF: NONE SEEN RBC/hpf (ref 0–5)

## 2020-12-01 LAB — LIPASE, BLOOD: Lipase: 24 U/L (ref 11–51)

## 2020-12-01 MED ORDER — OXYMETAZOLINE HCL 0.05 % NA SOLN
1.0000 | Freq: Once | NASAL | Status: AC
  Administered 2020-12-01: 1 via NASAL
  Filled 2020-12-01: qty 30

## 2020-12-01 MED ORDER — CEPHALEXIN 500 MG PO CAPS: 500.0000 mg | ORAL_CAPSULE | Freq: Two times a day (BID) | ORAL | 0 refills | Status: AC

## 2020-12-01 MED ORDER — ONDANSETRON 4 MG PO TBDP: 4.0000 mg | ORAL_TABLET | Freq: Three times a day (TID) | ORAL | 0 refills | Status: AC | PRN

## 2020-12-01 NOTE — ED Notes (Signed)
Pt given water for po challenge.

## 2020-12-01 NOTE — Discharge Instructions (Signed)
Use the Afrin twice daily.  I have written you for Zofran, a nausea medication.  Have also written you for antibiotics, Keflex.  Make sure to rest.  Drink plenty of fluids  Return for any worsening symptoms

## 2020-12-01 NOTE — ED Triage Notes (Signed)
Pt reports N/V/D x 1 week.  Nose bleed started 10 minutes PTA. Bright red blood with clots noted. Pt also spitting out blood

## 2020-12-01 NOTE — ED Notes (Signed)
Unable to obtain d/c signature due to sig pad not functioning

## 2020-12-01 NOTE — ED Provider Notes (Signed)
[facility] EMERGENCY DEPARTMENT Provider Note   CSN: [id number] Arrival date & time: 12/01/20  0912     History Chief Complaint  Patient presents with  . Epistaxis    Emma Hensley is a 35 y.o. female with no significant past medical history who presents for evaluation of epistaxis.  Patient states 1 hour prior to arrival she noticed diffuse, bright red bleeding from her nose with large clots.  Patient states it was "also coming out of my mouth."  No recent injury or trauma.  No prior history of nosebleed.  No anticoagulation.  Not tried anything for symptoms.  Patient does note that she has had nausea, vomiting, diarrhea x1 week.  Started after her birthday 1 week ago.  She has also had some myalgias, congestion and rhinorrhea.  Had COVID test on Tuesday which was a rapid test which was negative.  She is not vaccinated.  She denies any pain.  No abdominal pain.  No dysuria, hematuria.  Denies chance of pregnancy.  No fever, chills, chest pain, shortness of breath, hemoptysis, hematemesis, unilateral leg swelling, redness, warmth, abdominal pain.  No diarrhea over the last 2 days.  No recent antibiotics or travel denies additional aggravating or alleviating factors.  Last emesis 3 days ago.  Did not have any blood in her emesis.  History obtained from patient and past medical records.  No interpreter used.  HPI     History reviewed. No pertinent past medical history.  There are no problems to display for this patient.   Past Surgical History:  Procedure Laterality Date  . CESAREAN SECTION     2  . MOUTH SURGERY       OB History    Gravida  2   Para  2   Term  2   Preterm  0   AB  0   Living  2     SAB  0   IAB  0   Ectopic  0   Multiple  0   Live Births  2           Family History  Problem Relation Age of Onset  . Hypertension Paternal Grandfather   . Cancer Maternal Grandmother [id number]  . Breast cancer Maternal Grandmother   . Stroke  Maternal Grandfather   . Diabetes Maternal Grandfather   . Hypertension Mother   . Cancer Maternal Uncle        prostate    Social History   Tobacco Use  . Smoking status: Never Smoker  . Smokeless tobacco: Never Used  Vaping Use  . Vaping Use: Never used  Substance Use Topics  . Alcohol use: Yes    Comment: occasional  . Drug use: Yes    Types: Marijuana    Home Medications Prior to Admission medications   Medication Sig Start Date End Date Taking? Authorizing Provider  cephALEXin (KEFLEX) 500 MG capsule Take 1 capsule (500 mg total) by mouth 2 (two) times daily for 7 days. 12/01/20 12/08/20 Yes Dominque Levandowski A, PA-C  ondansetron (ZOFRAN ODT) 4 MG disintegrating tablet Take 1 tablet (4 mg total) by mouth every 8 (eight) hours as needed for nausea or vomiting. 12/01/20  Yes Haze Antillon A, PA-C  betamethasone valerate ointment (VALISONE) 0.1 % Apply 1 application topically 2 (two) times daily. 08/15/20   Willodean RosenthalHarraway-Smith, Carolyn, MD  diclofenac (VOLTAREN) 75 MG EC tablet Take 1 tablet 3 times daily as needed for pain. 08/15/20   Willodean RosenthalHarraway-Smith, Carolyn, MD  PENICILLIN V POTASSIUM PO Take by mouth.    [provider]    Allergies    Patient has no known allergies.  Review of Systems   Review of Systems  Constitutional: Negative.   HENT: Positive for congestion, nosebleeds, postnasal drip and rhinorrhea.   Respiratory: Negative.   Cardiovascular: Negative.   Gastrointestinal: Positive for diarrhea, nausea and vomiting.  Genitourinary: Negative.   Musculoskeletal: Positive for myalgias.  Skin: Negative.   Neurological: Negative.   All other systems reviewed and are negative.   Physical Exam Updated Vital Signs BP (!) 114/51 (BP Location: Right Arm)   Pulse 67   Temp 98.7 F (37.1 C) (Oral)   Resp 18   Ht 5\' 3"  (1.6 m)   Wt 77.1 kg   SpO2 100%   BMI 30.11 kg/m   Physical Exam Vitals and nursing note reviewed.  Constitutional:      General: She is  not in acute distress.    Appearance: She is well-developed and well-nourished. She is not ill-appearing, toxic-appearing or diaphoretic.  HENT:     Head: Normocephalic and atraumatic.     Jaw: There is normal jaw occlusion.     Right Ear: Tympanic membrane, ear canal and external ear normal. There is no impacted cerumen. No hemotympanum. Tympanic membrane is not injected, scarred, perforated, erythematous, retracted or bulging.     Left Ear: Tympanic membrane, ear canal and external ear normal. There is no impacted cerumen. No hemotympanum. Tympanic membrane is not injected, scarred, perforated, erythematous, retracted or bulging.     Ears:     Comments: No Mastoid tenderness.    Nose: Mucosal edema, congestion and rhinorrhea present. No nasal deformity, septal deviation, signs of injury, laceration or nasal tenderness. Rhinorrhea is clear and bloody.     Right Nostril: Epistaxis present.     Right Turbinates: Enlarged and swollen.     Left Turbinates: Enlarged and swollen.     Right Sinus: No maxillary sinus tenderness or frontal sinus tenderness.     Left Sinus: No maxillary sinus tenderness or frontal sinus tenderness.     Comments: Dried blood to posterior right nares.  No active bleeding.  She does have boggy nasal turbinates with diffuse congestion, clear rhinorrhea.    Mouth/Throat:     Lips: Pink.     Mouth: Mucous membranes are moist.     Pharynx: Oropharynx is clear. Uvula midline.     Comments: Posterior oropharynx clear.  Mucous membranes moist.  Tonsils without erythema or exudate.  Uvula midline without deviation.  No evidence of PTA or RPA.  No drooling, dysphasia or trismus.  Phonation normal.  No blood to back of throat.  No intraoral trauma. Eyes:     Pupils: Pupils are equal, round, and reactive to light.  Neck:     Trachea: Trachea and phonation normal.     Meningeal: Brudzinski's sign and Kernig's sign absent.     Comments: No Neck stiffness or neck rigidity.  No  meningismus.  No cervical lymphadenopathy. Cardiovascular:     Rate and Rhythm: Normal rate.     Pulses: Normal pulses and intact distal pulses.     Heart sounds: Normal heart sounds.     Comments: No murmurs rubs or gallops. Pulmonary:     Effort: Pulmonary effort is normal. No respiratory distress.     Breath sounds: Normal breath sounds.     Comments: Clear to auscultation bilaterally without wheeze, rhonchi or rales.  No accessory muscle  usage.  Able speak in full sentences. Abdominal:     General: Bowel sounds are normal. There is no distension.     Palpations: There is no mass.     Tenderness: There is no abdominal tenderness. There is no right CVA tenderness, left CVA tenderness, guarding or rebound.     Hernia: No hernia is present.     Comments: Soft, nontender without rebound or guarding.  No CVA tenderness.  Musculoskeletal:        General: Normal range of motion.     Cervical back: Normal range of motion.     Comments: Moves all 4 extremities without difficulty.  Lower extremities without edema, erythema or warmth.  Skin:    General: Skin is warm and dry.     Capillary Refill: Capillary refill takes less than 2 seconds.     Comments: Brisk capillary refill.  No rashes or lesions.  Neurological:     General: No focal deficit present.     Mental Status: She is alert.     Comments: Ambulatory in department without difficulty.  Cranial nerves II through XII grossly intact.  No facial droop.  No aphasia.  Psychiatric:        Mood and Affect: Mood and affect normal.     ED Results / Procedures / Treatments   Labs (all labs ordered are listed, but only abnormal results are displayed) Labs Reviewed  CBC WITH DIFFERENTIAL/PLATELET - Abnormal; Notable for the following components:      Result Value   RBC 5.17 (*)    All other components within normal limits  COMPREHENSIVE METABOLIC PANEL - Abnormal; Notable for the following components:   Potassium 3.4 (*)    Glucose, Bld  103 (*)    All other components within normal limits  URINALYSIS, ROUTINE W REFLEX MICROSCOPIC - Abnormal; Notable for the following components:   APPearance HAZY (*)    Ketones, ur 40 (*)    Protein, ur 30 (*)    All other components within normal limits  RAPID URINE DRUG SCREEN, HOSP PERFORMED - Abnormal; Notable for the following components:   Tetrahydrocannabinol POSITIVE (*)    All other components within normal limits  URINALYSIS, MICROSCOPIC (REFLEX) - Abnormal; Notable for the following components:   Bacteria, UA MANY (*)    All other components within normal limits  LIPASE, BLOOD  PREGNANCY, URINE    EKG None  Radiology No results found.  Procedures .Epistaxis Management  Date/Time: 12/01/2020 1:25 PM Performed by: Linwood Dibbles, PA-C Authorized by: Linwood Dibbles, PA-C   Consent:    Consent obtained:  Verbal   Consent given by:  Patient   Risks, benefits, and alternatives were discussed: yes     Risks discussed:  Bleeding, infection and nasal injury   Alternatives discussed:  Observation, alternative treatment, referral, delayed treatment and no treatment Universal protocol:    Procedure explained and questions answered to patient or proxy's satisfaction: yes     Relevant documents present and verified: yes     Test results available: yes     Imaging studies available: yes     Required blood products, implants, devices, and special equipment available: yes     Site/side marked: yes     Immediately prior to procedure, a time out was called: yes     Patient identity confirmed:  Verbally with patient Anesthesia:    Anesthesia method:  None Procedure details:    Treatment site: Afrin.   Treatment complexity:  Limited   Treatment episode: initial   Post-procedure details:    Assessment:  No improvement   Procedure completion:  Tolerated well, no immediate complications   (including critical care time)  Medications Ordered in ED Medications   oxymetazoline (AFRIN) 0.05 % nasal spray 1 spray (1 spray Each Nare Given by Other 12/01/20 1020)    ED Course  I have reviewed the triage vital signs and the nursing notes.  Pertinent labs & imaging results that were available during my care of the patient were reviewed by me and considered in my medical decision making (see chart for details).  35 year old presents for evaluation of nosebleed.  She is afebrile, nonseptic, not ill-appearing.  Had diffuse nosebleed on arrival per nursing with bright red blood from both nares with clots.  She was also spitting this up.  By the time I assessed patient I do not see any evidence of active bleed.  She did have some mild spitting of blood which patient states is "from a drippy nose."  Did attempt Afrin.  She was watched here in the emergency department for 4 hours without return of her epistaxis.  She is also been having 1 week of nausea, vomiting, diarrhea.  She was able to tolerate p.o. intake here in ED.  She has not been having any bloody emesis.  Her diarrhea resolved 2 days ago.  Her abdomen is soft, nontender.   Labs personally reviewed and interpreted:  CBC without leukocytosis, hemoglobin stable Metabolic panel, hypokalemia at 3.4, noticed electrolyte, renal or liver abnormality Lipase 24, pregnancy test negative, urine does have many bacteria.  She denies any vaginal discharge, concerns for STDs.  Did have some mild "burning" we will treat for UTI UDS is positive for THC.  Question multifactorial emesis, UTI as well as cannabinoid hyperemesis. Tolerating PO intake in ED without difficulty  Patient has been observed for extended period of time.  No return of epistaxis, no longer spitting up blood which I feel is likely due to the possible posterior nosebleed based off exam and history.  She denies any chest pain, shortness of breath, unilateral leg swelling, redness or warmth.  No hemoptysis or hematemesis.  I have low suspicion for PE,  bacterial infection, traumatic injury which would require imaging.  Patient states she feels improved and she would like to discharge.  She did question whether she could get a COVID test.  Discussed with patient given her recent epistaxis I do not recommend COVID testing as we risk return of her nosebleed.  Discussed  with patient outpatient testing as she has not had any further nosebleeds.  The patient has been appropriately medically screened and/or stabilized in the ED. I have low suspicion for any other emergent medical condition which would require further screening, evaluation or treatment in the ED or require inpatient management.  Patient is hemodynamically stable and in no acute distress.  Patient able to ambulate in department prior to ED.  Evaluation does not show acute pathology that would require ongoing or additional emergent interventions while in the emergency department or further inpatient treatment.  I have discussed the diagnosis with the patient and answered all questions.  Pain is been managed while in the emergency department and patient has no further complaints prior to discharge.  Patient is comfortable with plan discussed in room and is stable for discharge at this time.  I have discussed strict return precautions for returning to the emergency department.  Patient was encouraged to follow-up with  PCP/specialist refer to at discharge.    MDM Rules/Calculators/A&P                          Final Clinical Impression(s) / ED Diagnoses Final diagnoses:  Epistaxis  Acute cystitis without hematuria    Rx / DC Orders ED Discharge Orders         Ordered    cephALEXin (KEFLEX) 500 MG capsule  2 times daily        12/01/20 1315    ondansetron (ZOFRAN ODT) 4 MG disintegrating tablet  Every 8 hours PRN        12/01/20 1315           Jeidi Gilles A, PA-C 12/01/20 1327    Derwood Kaplan, MD 12/02/20 2033

## 2021-06-01 ENCOUNTER — Encounter (HOSPITAL_BASED_OUTPATIENT_CLINIC_OR_DEPARTMENT_OTHER): Payer: Self-pay | Admitting: Emergency Medicine

## 2021-06-01 ENCOUNTER — Other Ambulatory Visit: Payer: Self-pay

## 2021-06-01 ENCOUNTER — Emergency Department (HOSPITAL_BASED_OUTPATIENT_CLINIC_OR_DEPARTMENT_OTHER)
Admission: EM | Admit: 2021-06-01 | Discharge: 2021-06-01 | Disposition: A | Discharge status: Home / Self Care | Payer: Medicaid Other | Attending: Emergency Medicine | Admitting: Emergency Medicine

## 2021-06-01 DIAGNOSIS — K029 Dental caries, unspecified: Secondary | ICD-10-CM | POA: Diagnosis not present

## 2021-06-01 DIAGNOSIS — R220 Localized swelling, mass and lump, head: Secondary | ICD-10-CM

## 2021-06-01 DIAGNOSIS — K047 Periapical abscess without sinus: Secondary | ICD-10-CM

## 2021-06-01 MED ORDER — AMOXICILLIN 500 MG PO CAPS: 500.0000 mg | ORAL_CAPSULE | Freq: Three times a day (TID) | ORAL | 0 refills | Status: AC

## 2021-06-01 NOTE — ED Provider Notes (Signed)
[facility] EMERGENCY DEPARTMENT Provider Note   CSN: [id number] Arrival date & time: 06/01/21  4098     History Chief Complaint  Patient presents with   Facial Swelling    Emma Hensley is a 35 y.o. female.  Is a 35 year old female with no known medical problems presenting today with right lower jaw swelling that started yesterday and has been persistent.  No pain, fever, trouble swallowing.  She reported it did go down a little bit from yesterday but is still present and she does not know why.  She had oral surgery a few years ago but has not had issues with her teeth since.  She has not tried any new facial products, had any trauma and no bee stings or insect bites.  No throat or neck swelling associated with this.  The history is provided by the patient.      History reviewed. No pertinent past medical history.  There are no problems to display for this patient.   Past Surgical History:  Procedure Laterality Date   CESAREAN SECTION     2   MOUTH SURGERY       OB History     Gravida  2   Para  2   Term  2   Preterm  0   AB  0   Living  2      SAB  0   IAB  0   Ectopic  0   Multiple  0   Live Births  2           Family History  Problem Relation Age of Onset   Hypertension Paternal Grandfather    Cancer Maternal Grandmother 48   Breast cancer Maternal Grandmother    Stroke Maternal Grandfather    Diabetes Maternal Grandfather    Hypertension Mother    Cancer Maternal Uncle        prostate    Social History   Tobacco Use   Smoking status: Never   Smokeless tobacco: Never  Vaping Use   Vaping Use: Never used  Substance Use Topics   Alcohol use: Yes    Comment: occasional   Drug use: Not Currently    Types: Marijuana    Home Medications Prior to Admission medications   Medication Sig Start Date End Date Taking? Authorizing Provider  amoxicillin (AMOXIL) 500 MG capsule Take 1 capsule (500 mg total) by mouth 3  (three) times daily. 06/01/21  Yes Iasha Mccalister, Alphonzo Lemmings, MD  betamethasone valerate ointment (VALISONE) 0.1 % Apply 1 application topically 2 (two) times daily. 08/15/20   Willodean Rosenthal, MD  diclofenac (VOLTAREN) 75 MG EC tablet Take 1 tablet 3 times daily as needed for pain. 08/15/20   Willodean Rosenthal, MD  ondansetron (ZOFRAN ODT) 4 MG disintegrating tablet Take 1 tablet (4 mg total) by mouth every 8 (eight) hours as needed for nausea or vomiting. 12/01/20   Henderly, Britni A, PA-C  PENICILLIN V POTASSIUM PO Take by mouth.    [provider]    Allergies    Patient has no known allergies.  Review of Systems   Review of Systems  All other systems reviewed and are negative.  Physical Exam Updated Vital Signs BP 127/89 (BP Location: Right Arm)   Pulse 80   Temp 98.5 F (36.9 C) (Oral)   Resp 18   Ht 5\' 3"  (1.6 m)   Wt 77.1 kg   SpO2 100%   BMI 30.11 kg/m   Physical Exam  Vitals and nursing note reviewed.  Constitutional:      General: She is not in acute distress.    Appearance: Normal appearance. She is normal weight.  HENT:     Head: Normocephalic.      Nose: Nose normal.     Mouth/Throat:     Mouth: Mucous membranes are moist.     Tongue: No lesions.     Pharynx: Oropharynx is clear. No pharyngeal swelling, oropharyngeal exudate, posterior oropharyngeal erythema or uvula swelling.   Neck:     Comments: No stridor or trismus. Cardiovascular:     Rate and Rhythm: Normal rate.  Pulmonary:     Effort: Pulmonary effort is normal.  Musculoskeletal:     Cervical back: Normal range of motion and neck supple. No tenderness.  Lymphadenopathy:     Cervical: No cervical adenopathy.  Neurological:     Mental Status: She is alert. Mental status is at baseline.  Psychiatric:        Mood and Affect: Mood normal.        Behavior: Behavior normal.    ED Results / Procedures / Treatments   Labs (all labs ordered are listed, but only abnormal results are  displayed) Labs Reviewed - No data to display  EKG None  Radiology No results found.  Procedures Procedures   Medications Ordered in ED Medications - No data to display  ED Course  I have reviewed the triage vital signs and the nursing notes.  Pertinent labs & imaging results that were available during my care of the patient were reviewed by me and considered in my medical decision making (see chart for details).    MDM Rules/Calculators/A&P                          Patient presenting with nontender right lower jaw swelling in a similar area where she has a broken tooth.  She has no dental pain at this time but concern for dental infection.  No evidence of external skin abscess and no trauma history or history of insect bite or itching.  There is no neck involvement, stridor or swelling under the tongue.  Patient treated with antibiotics and will follow-up with her dentist Final Clinical Impression(s) / ED Diagnoses Final diagnoses:  Facial swelling  Dental infection    Rx / DC Orders ED Discharge Orders          Ordered    amoxicillin (AMOXIL) 500 MG capsule  3 times daily        06/01/21 0716             Gwyneth Sprout, MD 06/01/21 0745

## 2021-06-01 NOTE — ED Triage Notes (Signed)
Pt is c/o facial swelling on the right side of her face at her jaw line  Pt states it started yesterday morning  Pt denies pain at the area

## 2021-06-01 NOTE — Discharge Instructions (Addendum)
Try warm compresses.

## 2022-11-29 ENCOUNTER — Telehealth: Payer: Medicaid Other | Admitting: Family Medicine

## 2022-11-29 DIAGNOSIS — N644 Mastodynia: Secondary | ICD-10-CM | POA: Diagnosis not present

## 2022-11-29 NOTE — Patient Instructions (Signed)
Breast Tenderness Breast tenderness is a common problem for women of all ages, but may also occur in men. Breast tenderness has many possible causes, including hormone changes, infections, taking certain medicines, and caffeine intake. In women, the pain usually comes and goes with the menstrual cycle, but it can also be constant. Breast tenderness may range from mild discomfort to severe pain. You may have tests, such as a mammogram or an ultrasound, to check for any unusual findings. Having breast tenderness usually does not mean that you have breast cancer. Follow these instructions at home: Managing pain and discomfort  If directed, put ice on the painful area. To do this: Put ice in a plastic bag. Place a towel between your skin and the bag. Leave the ice on for 20 minutes, 2-3 times a day. If your skin turns bright red, remove the ice right away to prevent skin damage. The risk of skin damage is higher if you cannot feel pain, heat, or cold. Wear a supportive bra or chest support: During exercise. While sleeping, if your breasts are very tender. Medicines Take over-the-counter and prescription medicines only as told by your health care provider. If the cause of your pain is an infection, you may be prescribed an antibiotic medicine. If you were prescribed antibiotics, take them as told by your health care provider. Do not stop using the antibiotic even if you start to feel better. Eating and drinking Decrease the amount of caffeine in your diet. Instead, drink more water and choose caffeine-free drinks. Your health care provider may recommend that you lessen the amount of fat in your diet. You can do this by: Limiting fried foods. Cooking foods using methods such as baking, boiling, grilling, and broiling. General instructions  Keep a log of the days and times when your breasts are most tender. Ask your health care provider how to do breast exams at home. This will help you notice if  you have an unusual growth or lump. Keep all follow-up visits. Contact a health care provider if: Any part of your breast is hard, red, and hot to the touch. This may be a sign of infection. You are a woman and have a new or painful lump in your breast that remains after your menstrual period ends. You are not breastfeeding and you have fluid, especially blood or pus, coming out of your nipples. You have a fever. Your pain does not improve or it gets worse. Your pain is interfering with your daily activities. Summary Breast tenderness may range from mild discomfort to severe pain. Breast tenderness has many possible causes, including hormone changes, infections, taking certain medicines, and caffeine intake. It can be treated with ice, wearing a supportive bra or chest support, and medicines. Make changes to your diet as told by your health care provider. This information is not intended to replace advice given to you by your health care provider. Make sure you discuss any questions you have with your health care provider. Document Revised: 01/15/2022 Document Reviewed: 01/15/2022 [employer]vier Patient Education  2023 [employer]  

## 2022-11-29 NOTE — Progress Notes (Signed)
Virtual Visit Consent   Emma Hensley, you are scheduled for a virtual visit with a [facility] provider today. Just as with appointments in the office, your consent must be obtained to participate. Your consent will be active for this visit and any virtual visit you may have with one of our providers in the next 365 days. If you have a MyChart account, a copy of this consent can be sent to you electronically.  As this is a virtual visit, video technology does not allow for your provider to perform a traditional examination. This may limit your provider's ability to fully assess your condition. If your provider identifies any concerns that need to be evaluated in person or the need to arrange testing (such as labs, EKG, etc.), we will make arrangements to do so. Although advances in technology are sophisticated, we cannot ensure that it will always work on either your end or our end. If the connection with a video visit is poor, the visit may have to be switched to a telephone visit. With either a video or telephone visit, we are not always able to ensure that we have a secure connection.  By engaging in this virtual visit, you consent to the provision of healthcare and authorize for your insurance to be billed (if applicable) for the services provided during this visit. Depending on your insurance coverage, you may receive a charge related to this service.  I need to obtain your verbal consent now. Are you willing to proceed with your visit today? Emma Hensley has provided verbal consent on 11/29/2022 for a virtual visit (video or telephone). Dellia Nims, FNP  Date: 11/29/2022 8:11 AM  Virtual Visit via Video Note   I, Dellia Nims, connected with  Emma Hensley  ([id number], 11-11-1986) on 11/29/22 at  8:00 AM EST by a video-enabled telemedicine application and verified that I am speaking with the correct person using two identifiers.  Location: Patient: Virtual Visit Location Patient:  Home Provider: Virtual Visit Location Provider: Home Office   I discussed the limitations of evaluation and management by telemedicine and the availability of in person appointments. The patient expressed understanding and agreed to proceed.    History of Present Illness: Emma Hensley is a 37 y.o. who identifies as a female who was assigned female at birth, and is being seen today for breast tenderness. No redness, fever, discharge or PMH of problems. Her maternal grandmother had breast cancer so it concerns her. She is also concerned she may be perimenopausal. .  HPI: HPI  Problems: There are no problems to display for this patient.   Allergies: No Known Allergies Medications:  Current Outpatient Medications:    amoxicillin (AMOXIL) 500 MG capsule, Take 1 capsule (500 mg total) by mouth 3 (three) times daily., Disp: 21 capsule, Rfl: 0   betamethasone valerate ointment (VALISONE) 0.1 %, Apply 1 application topically 2 (two) times daily., Disp: 60 g, Rfl: 2   diclofenac (VOLTAREN) 75 MG EC tablet, Take 1 tablet 3 times daily as needed for pain., Disp: 60 tablet, Rfl: 2   ondansetron (ZOFRAN ODT) 4 MG disintegrating tablet, Take 1 tablet (4 mg total) by mouth every 8 (eight) hours as needed for nausea or vomiting., Disp: 20 tablet, Rfl: 0   PENICILLIN V POTASSIUM PO, Take by mouth., Disp: , Rfl:   Observations/Objective: Patient is well-developed, well-nourished in no acute distress.  Working at [employer] is normocephalic, atraumatic.  No labored breathing.  Speech is clear and coherent with logical  content.  Patient is alert and oriented at baseline.    Assessment and Plan: 1. Breast tenderness in female  Pt will get apptmt with her GYN or go to urgent care to get order for a diagnostic mammogram.   Follow Up Instructions: I discussed the assessment and treatment plan with the patient. The patient was provided an opportunity to ask questions and all were answered. The patient  agreed with the plan and demonstrated an understanding of the instructions.  A copy of instructions were sent to the patient via MyChart unless otherwise noted below.     The patient was advised to call back or seek an in-person evaluation if the symptoms worsen or if the condition fails to improve as anticipated.  Time:  I spent 10 minutes with the patient via telehealth technology discussing the above problems/concerns.    Dellia Nims, FNP

## 2023-08-05 ENCOUNTER — Telehealth: Payer: Self-pay

## 2023-08-05 ENCOUNTER — Encounter: Payer: Self-pay | Admitting: Obstetrics and Gynecology

## 2023-08-05 ENCOUNTER — Ambulatory Visit (INDEPENDENT_AMBULATORY_CARE_PROVIDER_SITE_OTHER): Payer: Medicaid Other | Admitting: Obstetrics and Gynecology

## 2023-08-05 ENCOUNTER — Other Ambulatory Visit (HOSPITAL_COMMUNITY)
Admission: RE | Admit: 2023-08-05 | Discharge: 2023-08-05 | Disposition: A | Discharge status: Home / Self Care | Payer: Medicaid Other | Source: Ambulatory Visit | Attending: Obstetrics & Gynecology | Admitting: Obstetrics & Gynecology

## 2023-08-05 VITALS — BP 99/59 | HR 75

## 2023-08-05 DIAGNOSIS — Z01419 Encounter for gynecological examination (general) (routine) without abnormal findings: Secondary | ICD-10-CM | POA: Insufficient documentation

## 2023-08-05 DIAGNOSIS — Z30433 Encounter for removal and reinsertion of intrauterine contraceptive device: Secondary | ICD-10-CM | POA: Diagnosis not present

## 2023-08-05 DIAGNOSIS — Z3202 Encounter for pregnancy test, result negative: Secondary | ICD-10-CM | POA: Diagnosis not present

## 2023-08-05 DIAGNOSIS — Z1339 Encounter for screening examination for other mental health and behavioral disorders: Secondary | ICD-10-CM | POA: Diagnosis not present

## 2023-08-05 DIAGNOSIS — Z01812 Encounter for preprocedural laboratory examination: Secondary | ICD-10-CM

## 2023-08-05 DIAGNOSIS — Z538 Procedure and treatment not carried out for other reasons: Secondary | ICD-10-CM

## 2023-08-05 LAB — POCT URINE PREGNANCY: Preg Test, Ur: NEGATIVE

## 2023-08-05 MED ORDER — MISOPROSTOL 200 MCG PO TABS: ORAL_TABLET | ORAL | 1 refills | Status: AC

## 2023-08-05 NOTE — Telephone Encounter (Signed)
Pharmacist Joselyn Glassman at [facility], called to verify that patient's Cytotec wasn't ordered to abort a fetus.  Informed pharmacist that Cytotec was not ordered to abort a fetus.Understanding was voiced. Jourdain Guay l Neiva Maenza, CMA

## 2023-08-05 NOTE — Addendum Note (Signed)
Addended by: Catalina Antigua on: 08/05/2023 03:14 PM   Modules accepted: Orders

## 2023-08-05 NOTE — Progress Notes (Signed)
Subjective:     Emma Hensley is a 37 y.o. female P2 with LMP 07/13/23 who is here for a comprehensive physical exam. The patient reports no problems. Patient reports amenorrhea with IUD until August. She is sexually active without complaints. She denies pelvic pain or abnormal discharge. She is without any other concerns. Her IUD has been in place for 7 years and is due for removal. She desires a new IUD  History reviewed. No pertinent past medical history. Past Surgical History:  Procedure Laterality Date   CESAREAN SECTION     2   MOUTH SURGERY     Family History  Problem Relation Age of Onset   Hypertension Paternal Grandfather    Cancer Maternal Grandmother 55   Breast cancer Maternal Grandmother    Stroke Maternal Grandfather    Diabetes Maternal Grandfather    Hypertension Mother    Cancer Maternal Uncle        prostate     Social History   Socioeconomic History   Marital status: Married    Spouse name: Not on file   Number of children: Not on file   Years of education: Not on file   Highest education level: Not on file  Occupational History   Not on file  Tobacco Use   Smoking status: Never   Smokeless tobacco: Never  Vaping Use   Vaping status: Never Used  Substance and Sexual Activity   Alcohol use: Yes    Comment: occasional   Drug use: Not Currently    Types: Marijuana   Sexual activity: Yes    Birth control/protection: I.U.D.  Other Topics Concern   Not on file  Social History Narrative   Not on file   Social Determinants of Health   Financial Resource Strain: Not on file  Food Insecurity: Not on file  Transportation Needs: Not on file  Physical Activity: Not on file  Stress: Not on file  Social Connections: Not on file  Intimate Partner Violence: Not on file   Health Maintenance  Topic Date Due   DTaP/Tdap/Td (1 - Tdap) Never done   INFLUENZA VACCINE  Never done   COVID-19 Vaccine (1 - 2023-24 season) Never done   Cervical Cancer  Screening (HPV/Pap Cotest)  08/15/2025   Hepatitis C Screening  Completed   HIV Screening  Completed   HPV VACCINES  Aged Out       Review of Systems Pertinent items noted in HPI and remainder of comprehensive ROS otherwise negative.   Objective:  Blood pressure (!) 99/59, pulse 75, last menstrual period 07/13/2023.   GENERAL: Well-developed, well-nourished female in no acute distress.  HEENT: Normocephalic, atraumatic. Sclerae anicteric.  NECK: Supple. Normal thyroid.  LUNGS: Clear to auscultation bilaterally.  HEART: Regular rate and rhythm. BREASTS: Symmetric in size. No palpable masses or lymphadenopathy, skin changes, or nipple drainage. ABDOMEN: Soft, nontender, nondistended. No organomegaly. PELVIC: Normal external female genitalia. Vagina is pink and rugated.  Normal discharge. Normal appearing cervix. Uterus is normal in size. No adnexal mass or tenderness. Chaperone present during the pelvic exam EXTREMITIES: No cyanosis, clubbing, or edema, 2+ distal pulses.     Assessment:    Healthy female exam.      Plan:    Pap smear collected Screening mammogram ordered STI screening per patient request Patient will be contacted with abnormal results  GYNECOLOGY CLINIC PROCEDURE NOTE  IUD Removal  Patient identified, informed consent performed, consent signed.  Patient was in the dorsal lithotomy position, normal  external genitalia was noted.  A speculum was placed in the patient's vagina, normal discharge was noted, no lesions. The cervix was visualized, no lesions, no abnormal discharge.  The strings of the IUD were not visualized, so Kelly forceps were introduced into the endometrial cavity and the IUD was grasped and removed in its entirety.  Patient tolerated the procedure well.    IUD Procedure Note Speculum placed in the vagina.  Cervix visualized.  Cleaned with Betadine x 2.  Grasped anteriorly with a single tooth tenaculum.  Liletta IUD could not be inserted even  after attempting to dilated the uterus due to patient discomfort. Tenaculum was removed, good hemostasis noted.  Patient tolerated procedure well.   Patient will return for IUD insertion following cytotec      See After Visit Summary for Counseling Recommendations

## 2023-08-06 ENCOUNTER — Telehealth (HOSPITAL_BASED_OUTPATIENT_CLINIC_OR_DEPARTMENT_OTHER): Payer: Self-pay

## 2023-08-10 ENCOUNTER — Other Ambulatory Visit: Payer: Self-pay

## 2023-08-10 DIAGNOSIS — R109 Unspecified abdominal pain: Secondary | ICD-10-CM

## 2023-08-10 LAB — CYTOLOGY - PAP
Chlamydia: NEGATIVE
Comment: NEGATIVE
Comment: NEGATIVE
Comment: NORMAL
High risk HPV: NEGATIVE
Neisseria Gonorrhea: NEGATIVE

## 2023-08-10 MED ORDER — IBUPROFEN 800 MG PO TABS: 800.0000 mg | ORAL_TABLET | Freq: Three times a day (TID) | ORAL | 1 refills | Status: AC | PRN

## 2023-08-11 ENCOUNTER — Telehealth: Payer: Self-pay

## 2023-08-11 DIAGNOSIS — B9689 Other specified bacterial agents as the cause of diseases classified elsewhere: Secondary | ICD-10-CM

## 2023-08-11 MED ORDER — METRONIDAZOLE 500 MG PO TABS: 500.0000 mg | ORAL_TABLET | Freq: Two times a day (BID) | ORAL | 0 refills | Status: AC

## 2023-08-11 NOTE — Telephone Encounter (Signed)
-----   Message from Jaynie Collins sent at 08/11/2023  9:39 AM EDT ----- Regarding: RE: LGSIL Pap smear This is Dr. Macon Large covering for Dr. Jolayne Panther who is on vacation.  She needs a repeat colposcopy.   Jaynie Collins, MD ----- Message ----- From: Mikey Bussing, CMA Sent: 08/11/2023   9:28 AM EDT To: Catalina Antigua, MD Subject: LGSIL Pap smear                                This patient's Pap shows LGSIL and she wants to know next steps. Please advise.

## 2023-08-11 NOTE — Telephone Encounter (Signed)
Called patient to inform her that her Pap smear shows LGSIL and to schedule a Colpo. Explained the procedure to the patient and her husband. Understanding was voiced. Macrae Wiegman l Jaquetta Currier, CMA

## 2023-08-19 ENCOUNTER — Encounter: Payer: Self-pay | Admitting: Obstetrics & Gynecology

## 2023-08-19 ENCOUNTER — Ambulatory Visit: Payer: Medicaid Other | Admitting: Obstetrics & Gynecology

## 2023-08-19 ENCOUNTER — Other Ambulatory Visit: Payer: Self-pay | Admitting: Obstetrics & Gynecology

## 2023-08-26 ENCOUNTER — Telehealth: Payer: Self-pay

## 2023-08-26 NOTE — Telephone Encounter (Signed)
Patient called to verify that metronidazole and flagyl was the same medication. Advised patient it is the same medication. Patient voiced understanding.

## 2023-09-02 ENCOUNTER — Encounter: Payer: Medicaid Other | Admitting: Obstetrics & Gynecology

## 2023-09-08 ENCOUNTER — Other Ambulatory Visit: Payer: Self-pay

## 2023-09-08 DIAGNOSIS — Z803 Family history of malignant neoplasm of breast: Secondary | ICD-10-CM

## 2023-09-10 ENCOUNTER — Inpatient Hospital Stay (HOSPITAL_BASED_OUTPATIENT_CLINIC_OR_DEPARTMENT_OTHER): Admission: RE | Admit: 2023-09-10 | Payer: Medicaid Other | Source: Ambulatory Visit

## 2023-10-19 ENCOUNTER — Encounter: Payer: Medicaid Other | Admitting: Obstetrics and Gynecology

## 2024-01-20 ENCOUNTER — Encounter: Payer: Medicaid Other | Admitting: Obstetrics & Gynecology

## 2024-02-17 ENCOUNTER — Ambulatory Visit: Admitting: Obstetrics & Gynecology

## 2024-02-17 VITALS — BP 120/50 | HR 75 | Wt 186.0 lb

## 2024-02-17 DIAGNOSIS — Z3202 Encounter for pregnancy test, result negative: Secondary | ICD-10-CM

## 2024-02-17 DIAGNOSIS — Z30430 Encounter for insertion of intrauterine contraceptive device: Secondary | ICD-10-CM

## 2024-02-17 DIAGNOSIS — R87612 Low grade squamous intraepithelial lesion on cytologic smear of cervix (LGSIL): Secondary | ICD-10-CM

## 2024-02-17 LAB — POCT URINE PREGNANCY: Preg Test, Ur: NEGATIVE

## 2024-02-17 MED ORDER — LEVONORGESTREL 20 MCG/DAY IU IUD
1.0000 | INTRAUTERINE_SYSTEM | Freq: Once | INTRAUTERINE | Status: AC
  Administered 2024-02-17: 1 via INTRAUTERINE

## 2024-02-17 MED ORDER — IBUPROFEN 200 MG PO TABS
800.0000 mg | ORAL_TABLET | Freq: Once | ORAL | Status: AC
  Administered 2024-02-17: 800 mg via ORAL

## 2024-02-17 NOTE — Patient Instructions (Signed)
 IUD PLACEMENT POST-PROCEDURE INSTRUCTIONS  You may take Ibuprofen, Aleve or Tylenol for pain if needed.  Cramping should resolve within in 24 hours.  You may have a small amount of spotting.  You should wear a mini pad for the next few days.  You may have intercourse after 24 hours.  If you using this for birth control, it is effective immediately.  You need to call if you have any pelvic pain, fever, heavy bleeding or foul smelling vaginal discharge.  Irregular bleeding is common the first several months after having an IUD placed. You do not need to call for this reason unless you are concerned.  Shower or bathe as normal  You should have a follow-up appointment in 4-8 weeks for a re-check to make sure you are not having any problems.   Levonorgestrel Intrauterine Device (IUD) What is this medication? LEVONORGESTREL (LEE voe nor jes trel) prevents ovulation and pregnancy. It may also be used to treat heavy periods. It belongs to a group of medications called contraceptives. This medication is a progestin hormone. This medicine may be used for other purposes; ask your health care provider or pharmacist if you have questions. COMMON BRAND NAME(S): Cameron Ali What should I tell my care team before I take this medication? They need to know if you have any of these conditions: Abnormal Pap smear Cancer of the breast, uterus, or cervix Diabetes Endometritis Genital or pelvic infection now or in the past Have more than one sexual partner or your partner has more than one partner Heart disease History of an ectopic or tubal pregnancy Immune system problems IUD in place Liver disease or tumor Problems with blood clots or take blood-thinners Seizures Use intravenous drugs Uterus of unusual shape Vaginal bleeding that has not been explained An unusual or allergic reaction to levonorgestrel, other hormones, silicone, or polyethylene, medications, foods, dyes, or  preservatives Pregnant or trying to get pregnant Breast-feeding How should I use this medication? This device is placed inside the uterus by your care team. A patient package insert for the product will be given each time it is inserted. Be sure to read this information carefully each time. The sheet may change often. Talk to your care team about use of this medication in children. Special care may be needed. Overdosage: If you think you have taken too much of this medicine contact a poison control center or emergency room at once. NOTE: This medicine is only for you. Do not share this medicine with others. What if I miss a dose? This does not apply. Depending on the brand of device you have inserted, the device will need to be replaced every 3 to 8 years if you wish to continue using this type of birth control. What may interact with this medication? Interactions are not expected. Tell your care team about all the medications you take. This list may not describe all possible interactions. Give your health care provider a list of all the medicines, herbs, non-prescription drugs, or dietary supplements you use. Also tell them if you smoke, drink alcohol, or use illegal drugs. Some items may interact with your medicine. What should I watch for while using this medication? Visit your care team for regular check-ups. Tell your care team if you or your partner becomes HIV positive or gets a sexually transmitted disease. Using this medication does not protect you or your partner against HIV or other sexually transmitted infections (STIs). You can check the placement of the IUD  yourself by reaching up to the top of your vagina with clean fingers to feel the threads. Do not pull on the threads. It is a good habit to check placement after each menstrual period. Call your care team right away if you feel more of the IUD than just the threads or if you cannot feel the threads at all. The IUD may come out by  itself. You may become pregnant if the device comes out. If you notice that the IUD has come out use a backup birth control method like condoms and call your care team. Using tampons will not change the position of the IUD and are okay to use during your period. This IUD can be safely scanned with magnetic resonance imaging (MRI) only under specific conditions. Before you have an MRI, tell your care team that you have an IUD in place, and which type of IUD you have in place. What side effects may I notice from receiving this medication? Side effects that you should report to your care team as soon as possible: Allergic reactions--skin rash, itching, hives, swelling of the face, lips, tongue, or throat Blood clot--pain, swelling, or warmth in the leg, shortness of breath, chest pain Gallbladder problems--severe stomach pain, nausea, vomiting, fever Increase in blood pressure Liver injury--right upper belly pain, loss of appetite, nausea, light-colored stool, dark yellow or brown urine, yellowing skin or eyes, unusual weakness or fatigue New or worsening migraines or headaches Pelvic inflammatory disease (PID)--fever, abdominal pain, pelvic pain, pain or trouble passing urine, spotting, bleeding during or after sex Stroke--sudden numbness or weakness of the face, arm, or leg, trouble speaking, confusion, trouble walking, loss of balance or coordination, dizziness, severe headache, change in vision Unusual vaginal discharge, itching, or odor Vaginal pain, irritation, or sores Worsening mood, feelings of depression Side effects that usually do not require medical attention (report to your care team if they continue or are bothersome): Breast pain or tenderness Dark patches of skin on the face or other sun-exposed areas Irregular menstrual cycles or spotting Nausea Weight gain This list may not describe all possible side effects. Call your doctor for medical advice about side effects. You may report  side effects to FDA at 1-800-FDA-1088. Where should I keep my medication? This does not apply. NOTE: This sheet is a summary. It may not cover all possible information. If you have questions about this medicine, talk to your doctor, pharmacist, or health care provider.  2024 Elsevier/Gold Standard (2021-07-17 00:00:00)

## 2024-02-17 NOTE — Progress Notes (Signed)
 GYNECOLOGY CLINIC PROCEDURE NOTE  Emma Hensley is a 38 y.o. [id number] here for Mirena  IUD insertion. No GYN concerns.  Last pap smear was on 08/05/2023 LGSIL. With neg hrHPV. .  IUD Insertion Procedure Note Patient identified, informed consent performed.  Discussed risks of irregular bleeding, cramping, infection, malpositioning or misplacement of the IUD outside the uterus which may require further procedures. Time out was performed.  Urine pregnancy test negative.  Speculum placed in the vagina.  Cervix visualized.  Cleaned with Betadine x 2.  Grasped anteriorly with a single tooth tenaculum.  Uterus sounded to 8 cm.  Mirena  IUD placed per manufacturer's recommendations.  Strings trimmed to 3 cm. Tenaculum was removed, good hemostasis noted.  Patient tolerated procedure well.   Patient was given post-procedure instructions.  Patient was asked to follow up in 4 weeks for IUD check. F/u for repat PAP 07/2024   Yamilette Garretson L. Harraway-Smith, M.D., FACOG

## 2024-03-09 ENCOUNTER — Encounter: Payer: Self-pay | Admitting: Obstetrics & Gynecology

## 2024-07-15 ENCOUNTER — Telehealth: Payer: Self-pay

## 2024-07-15 NOTE — Telephone Encounter (Signed)
 Prolonged break-through bleeding sometimes happens with an IUD. Usually it has to do with the thickness of the endometrium (usually with it becoming too thin). If the bleeding has stopped, then nothing else to do for the moment. If it returns, let us  know and we will order an ultrasound and talk about options.

## 2024-07-15 NOTE — Telephone Encounter (Signed)
 Patient called in stating that she has had a consistent cycle with her IUD that she had placed back in April. She stated that she has never had a cycle with an IUD and wants to know why she is having all of this bleeding. She said her most recent cycle started on 8/7 and she just stopped spotting yesterday.   Would like to know why this is happening. Informed patient that I would route this to one of our providers since we will be having a long weekend and closed on Monday.

## 2024-08-09 ENCOUNTER — Ambulatory Visit

## 2024-08-16 ENCOUNTER — Ambulatory Visit: Admitting: Obstetrics and Gynecology

## 2024-08-18 ENCOUNTER — Ambulatory Visit: Admitting: Family Medicine

## 2024-08-18 VITALS — BP 112/59 | HR 67 | Wt 180.0 lb

## 2024-08-18 DIAGNOSIS — N951 Menopausal and female climacteric states: Secondary | ICD-10-CM

## 2024-08-18 DIAGNOSIS — Z30431 Encounter for routine checking of intrauterine contraceptive device: Secondary | ICD-10-CM | POA: Diagnosis not present

## 2024-08-18 DIAGNOSIS — R5383 Other fatigue: Secondary | ICD-10-CM

## 2024-08-18 MED ORDER — PROGESTERONE MICRONIZED 100 MG PO CAPS: 100.0000 mg | ORAL_CAPSULE | Freq: Every day | ORAL | 3 refills | Status: AC

## 2024-08-18 MED ORDER — ESTRADIOL 0.05 MG/24HR TD PTTW: 1.0000 | MEDICATED_PATCH | TRANSDERMAL | 12 refills | Status: AC

## 2024-08-18 NOTE — Progress Notes (Unsigned)
   Subjective:    Patient ID: Emma Hensley, female    DOB: 1986-08-30, 38 y.o.   MRN: [id number]  HPI Had IUD placed in April. Having monthly periods. Periods increased in frequency slightly to every 24 days. She's a little confused because she didn't have a period when she had an IUD previously.   She does have difficulty sleeping and is restless. Increased mood lability. Also having some night sweats occasionally and vaginal dryness. No abdominal pain, nausea, vomiting.    Review of Systems     Objective:   Physical Exam Vitals reviewed. Exam conducted with a chaperone present.  Constitutional:      Appearance: Normal appearance.  Abdominal:     General: Abdomen is flat.     Hernia: There is no hernia in the left inguinal area or right inguinal area.  Genitourinary:    Exam position: Supine.     Labia:        Right: No rash, tenderness or lesion.        Left: No rash, tenderness or lesion.      Lymphadenopathy:     Lower Body: No right inguinal adenopathy. No left inguinal adenopathy.  Skin:    Capillary Refill: Capillary refill takes less than 2 seconds.  Neurological:     General: No focal deficit present.     Mental Status: She is alert.  Psychiatric:        Mood and Affect: Mood normal.        Behavior: Behavior normal.        Thought Content: Thought content normal.        Judgment: Judgment normal.        Assessment & Plan:  1. Perimenopause (Primary) Discussed perimenopausal symptoms with patient -discussed options.  At this point, she would like to start hormone therapy.  Will start progesterone for her sleep disturbances and estrogen patch for vaginal dryness and mood irritability.  Will follow-up in 3 to 4 months to evaluate effectiveness.  In addition to this, the hormones may help to stabilize the endometrium.  I did warn the patient that this was not guaranteed   2. Other fatigue Check labs - CBC - TSH Rfx on Abnormal to Free T4 - Iron, TIBC and  Ferritin Panel  3. IUD check up IUD in place.  We discussed that she may continue to have periods and that approximately 40% of women with IUDs do have a menstrual period.  This may occur even though she did not have a period with a prior IUD.

## 2024-08-19 ENCOUNTER — Ambulatory Visit: Payer: Self-pay | Admitting: Family Medicine

## 2024-08-19 LAB — CBC
Hematocrit: 40.1 % (ref 34.0–46.6)
Hemoglobin: 12.8 g/dL (ref 11.1–15.9)
MCH: 27.5 pg (ref 26.6–33.0)
MCHC: 31.9 g/dL (ref 31.5–35.7)
MCV: 86 fL (ref 79–97)
Platelets: 327 x10E3/uL (ref 150–450)
RBC: 4.65 x10E6/uL (ref 3.77–5.28)
RDW: 12.1 % (ref 11.7–15.4)
WBC: 11.6 x10E3/uL — ABNORMAL HIGH (ref 3.4–10.8)

## 2024-08-19 LAB — IRON,TIBC AND FERRITIN PANEL
Ferritin: 135 ng/mL (ref 15–150)
Iron Saturation: 25 % (ref 15–55)
Iron: 64 ug/dL (ref 27–159)
Total Iron Binding Capacity: 256 ug/dL (ref 250–450)
UIBC: 192 ug/dL (ref 131–425)

## 2024-08-19 LAB — TSH RFX ON ABNORMAL TO FREE T4: TSH: 0.674 u[IU]/mL (ref 0.450–4.500)

## 2024-08-23 ENCOUNTER — Ambulatory Visit
